# Patient Record
Sex: Male | Born: 1980 | Race: Black or African American | Hispanic: No | Marital: Single | Smoking: Former smoker
Health system: Southern US, Community
[De-identification: ages and names within clinical notes are randomized; demographics above are authoritative.]

## PROBLEM LIST (undated history)

## (undated) DIAGNOSIS — G5601 Carpal tunnel syndrome, right upper limb: Secondary | ICD-10-CM

## (undated) DIAGNOSIS — S329XXA Fracture of unspecified parts of lumbosacral spine and pelvis, initial encounter for closed fracture: Secondary | ICD-10-CM

## (undated) DIAGNOSIS — G43909 Migraine, unspecified, not intractable, without status migrainosus: Secondary | ICD-10-CM

---
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
## 2002-05-02 ENCOUNTER — Encounter: Payer: Self-pay | Admitting: Emergency Medicine

## 2002-05-02 ENCOUNTER — Emergency Department (HOSPITAL_COMMUNITY): Admission: EM | Admit: 2002-05-02 | Discharge: 2002-05-02 | Payer: Self-pay | Admitting: Emergency Medicine

## 2005-08-10 ENCOUNTER — Emergency Department (HOSPITAL_COMMUNITY): Admission: EM | Admit: 2005-08-10 | Discharge: 2005-08-10 | Payer: Self-pay | Admitting: Family Medicine

## 2006-03-26 ENCOUNTER — Emergency Department (HOSPITAL_COMMUNITY): Admission: EM | Admit: 2006-03-26 | Discharge: 2006-03-26 | Payer: Self-pay | Admitting: Family Medicine

## 2006-03-28 ENCOUNTER — Emergency Department (HOSPITAL_COMMUNITY): Admission: EM | Admit: 2006-03-28 | Discharge: 2006-03-28 | Payer: Self-pay | Admitting: Family Medicine

## 2006-04-05 ENCOUNTER — Emergency Department (HOSPITAL_COMMUNITY): Admission: EM | Admit: 2006-04-05 | Discharge: 2006-04-05 | Payer: Self-pay | Admitting: Family Medicine

## 2006-04-20 ENCOUNTER — Emergency Department (HOSPITAL_COMMUNITY): Admission: EM | Admit: 2006-04-20 | Discharge: 2006-04-20 | Payer: Self-pay | Admitting: Family Medicine

## 2006-09-30 ENCOUNTER — Emergency Department (HOSPITAL_COMMUNITY): Admission: EM | Admit: 2006-09-30 | Discharge: 2006-09-30 | Payer: Self-pay | Admitting: Emergency Medicine

## 2007-10-08 ENCOUNTER — Emergency Department (HOSPITAL_COMMUNITY): Admission: EM | Admit: 2007-10-08 | Discharge: 2007-10-08 | Payer: Self-pay | Admitting: Emergency Medicine

## 2008-11-17 ENCOUNTER — Emergency Department (HOSPITAL_COMMUNITY): Admission: EM | Admit: 2008-11-17 | Discharge: 2008-11-17 | Payer: Self-pay | Admitting: Emergency Medicine

## 2008-11-18 ENCOUNTER — Emergency Department (HOSPITAL_COMMUNITY): Admission: EM | Admit: 2008-11-18 | Discharge: 2008-11-18 | Payer: Self-pay | Admitting: Emergency Medicine

## 2009-10-06 ENCOUNTER — Emergency Department (HOSPITAL_COMMUNITY): Admission: EM | Admit: 2009-10-06 | Discharge: 2009-10-06 | Payer: Self-pay | Admitting: Family Medicine

## 2010-08-12 ENCOUNTER — Inpatient Hospital Stay (INDEPENDENT_AMBULATORY_CARE_PROVIDER_SITE_OTHER)
Admission: RE | Admit: 2010-08-12 | Discharge: 2010-08-12 | Disposition: A | Discharge status: Home / Self Care | Payer: Self-pay | Source: Ambulatory Visit | Attending: Family Medicine | Admitting: Family Medicine

## 2010-08-12 DIAGNOSIS — K5289 Other specified noninfective gastroenteritis and colitis: Secondary | ICD-10-CM

## 2010-08-27 ENCOUNTER — Emergency Department (HOSPITAL_COMMUNITY): Payer: Self-pay

## 2010-08-27 ENCOUNTER — Emergency Department (HOSPITAL_COMMUNITY)
Admission: EM | Admit: 2010-08-27 | Discharge: 2010-08-27 | Disposition: A | Discharge status: Home / Self Care | Payer: Self-pay | Attending: Emergency Medicine | Admitting: Emergency Medicine

## 2010-08-27 DIAGNOSIS — IMO0002 Reserved for concepts with insufficient information to code with codable children: Secondary | ICD-10-CM | POA: Insufficient documentation

## 2010-08-27 DIAGNOSIS — M7989 Other specified soft tissue disorders: Secondary | ICD-10-CM | POA: Insufficient documentation

## 2010-08-27 DIAGNOSIS — G8929 Other chronic pain: Secondary | ICD-10-CM | POA: Insufficient documentation

## 2010-08-27 DIAGNOSIS — X58XXXA Exposure to other specified factors, initial encounter: Secondary | ICD-10-CM | POA: Insufficient documentation

## 2010-08-27 DIAGNOSIS — M79609 Pain in unspecified limb: Secondary | ICD-10-CM | POA: Insufficient documentation

## 2010-08-27 DIAGNOSIS — M549 Dorsalgia, unspecified: Secondary | ICD-10-CM | POA: Insufficient documentation

## 2010-09-24 ENCOUNTER — Inpatient Hospital Stay (INDEPENDENT_AMBULATORY_CARE_PROVIDER_SITE_OTHER)
Admission: RE | Admit: 2010-09-24 | Discharge: 2010-09-24 | Disposition: A | Discharge status: Home / Self Care | Payer: Self-pay | Source: Ambulatory Visit | Attending: Family Medicine | Admitting: Family Medicine

## 2010-09-24 DIAGNOSIS — M79609 Pain in unspecified limb: Secondary | ICD-10-CM

## 2010-09-24 DIAGNOSIS — M659 Synovitis and tenosynovitis, unspecified: Secondary | ICD-10-CM

## 2010-10-27 ENCOUNTER — Inpatient Hospital Stay (INDEPENDENT_AMBULATORY_CARE_PROVIDER_SITE_OTHER)
Admission: RE | Admit: 2010-10-27 | Discharge: 2010-10-27 | Disposition: A | Discharge status: Home / Self Care | Payer: Self-pay | Source: Ambulatory Visit | Attending: Family Medicine | Admitting: Family Medicine

## 2010-10-27 DIAGNOSIS — M940 Chondrocostal junction syndrome [Tietze]: Secondary | ICD-10-CM

## 2010-10-27 DIAGNOSIS — J3489 Other specified disorders of nose and nasal sinuses: Secondary | ICD-10-CM

## 2010-10-27 DIAGNOSIS — J309 Allergic rhinitis, unspecified: Secondary | ICD-10-CM

## 2010-12-01 ENCOUNTER — Emergency Department (HOSPITAL_COMMUNITY)
Admission: EM | Admit: 2010-12-01 | Discharge: 2010-12-02 | Disposition: A | Discharge status: Home / Self Care | Payer: Self-pay | Attending: Emergency Medicine | Admitting: Emergency Medicine

## 2010-12-01 DIAGNOSIS — R6889 Other general symptoms and signs: Secondary | ICD-10-CM | POA: Insufficient documentation

## 2010-12-01 DIAGNOSIS — J45909 Unspecified asthma, uncomplicated: Secondary | ICD-10-CM | POA: Insufficient documentation

## 2010-12-01 DIAGNOSIS — M79609 Pain in unspecified limb: Secondary | ICD-10-CM | POA: Insufficient documentation

## 2010-12-01 DIAGNOSIS — R0602 Shortness of breath: Secondary | ICD-10-CM | POA: Insufficient documentation

## 2010-12-01 DIAGNOSIS — M62838 Other muscle spasm: Secondary | ICD-10-CM | POA: Insufficient documentation

## 2010-12-01 DIAGNOSIS — M545 Low back pain, unspecified: Secondary | ICD-10-CM | POA: Insufficient documentation

## 2010-12-01 DIAGNOSIS — R0989 Other specified symptoms and signs involving the circulatory and respiratory systems: Secondary | ICD-10-CM | POA: Insufficient documentation

## 2010-12-01 DIAGNOSIS — R0609 Other forms of dyspnea: Secondary | ICD-10-CM | POA: Insufficient documentation

## 2010-12-02 ENCOUNTER — Emergency Department (HOSPITAL_COMMUNITY): Payer: Self-pay

## 2010-12-02 LAB — DIFFERENTIAL
Basophils Absolute: 0 10*3/uL (ref 0.0–0.1)
Basophils Relative: 0 % (ref 0–1)
Eosinophils Absolute: 0.6 10*3/uL (ref 0.0–0.7)
Eosinophils Relative: 8 % — ABNORMAL HIGH (ref 0–5)
Lymphocytes Relative: 31 % (ref 12–46)
Lymphs Abs: 2.2 10*3/uL (ref 0.7–4.0)
Monocytes Absolute: 0.5 10*3/uL (ref 0.1–1.0)
Monocytes Relative: 7 % (ref 3–12)
Neutro Abs: 3.7 10*3/uL (ref 1.7–7.7)
Neutrophils Relative %: 53 % (ref 43–77)

## 2010-12-02 LAB — CBC
HCT: 34.6 % — ABNORMAL LOW (ref 39.0–52.0)
Hemoglobin: 12.5 g/dL — ABNORMAL LOW (ref 13.0–17.0)
MCH: 27.9 pg (ref 26.0–34.0)
MCHC: 36.1 g/dL — ABNORMAL HIGH (ref 30.0–36.0)
MCV: 77.2 fL — ABNORMAL LOW (ref 78.0–100.0)
Platelets: 193 10*3/uL (ref 150–400)
RBC: 4.48 MIL/uL (ref 4.22–5.81)
RDW: 12.9 % (ref 11.5–15.5)
WBC: 7 10*3/uL (ref 4.0–10.5)

## 2010-12-02 LAB — COMPREHENSIVE METABOLIC PANEL
ALT: 41 U/L (ref 0–53)
AST: 35 U/L (ref 0–37)
Albumin: 3.8 g/dL (ref 3.5–5.2)
Alkaline Phosphatase: 49 U/L (ref 39–117)
BUN: 19 mg/dL (ref 6–23)
CO2: 28 mEq/L (ref 19–32)
Calcium: 8.8 mg/dL (ref 8.4–10.5)
Chloride: 101 mEq/L (ref 96–112)
Creatinine, Ser: 1.05 mg/dL (ref 0.4–1.5)
GFR calc Af Amer: >60 mL/min (ref >60)
GFR calc non Af Amer: >60 mL/min (ref >60)
Glucose, Bld: 132 mg/dL — ABNORMAL HIGH (ref 70–99)
Potassium: 3.6 mEq/L (ref 3.5–5.1)
Sodium: 136 mEq/L (ref 135–145)
Total Bilirubin: 0.2 mg/dL — ABNORMAL LOW (ref 0.3–1.2)
Total Protein: 6.7 g/dL (ref 6.0–8.3)

## 2010-12-02 LAB — CK TOTAL AND CKMB (NOT AT ARMC)
CK, MB: 5.7 ng/mL — ABNORMAL HIGH (ref 0.3–4.0)
Relative Index: 1.1 (ref 0.0–2.5)
Total CK: 540 U/L — ABNORMAL HIGH (ref 7–232)

## 2011-03-15 LAB — POCT URINALYSIS DIP (DEVICE)
Hgb urine dipstick: NEGATIVE
Ketones, ur: NEGATIVE
Operator id: 116391
Protein, ur: NEGATIVE
Specific Gravity, Urine: 1.015

## 2011-06-21 DEATH — deceased

## 2011-11-09 ENCOUNTER — Emergency Department (HOSPITAL_COMMUNITY)
Admission: EM | Admit: 2011-11-09 | Discharge: 2011-11-09 | Disposition: A | Discharge status: Home / Self Care | Payer: Self-pay | Attending: Emergency Medicine | Admitting: Emergency Medicine

## 2011-11-09 ENCOUNTER — Emergency Department (HOSPITAL_COMMUNITY): Payer: Self-pay

## 2011-11-09 ENCOUNTER — Encounter (HOSPITAL_COMMUNITY): Payer: Self-pay | Admitting: Emergency Medicine

## 2011-11-09 DIAGNOSIS — S6710XA Crushing injury of unspecified finger(s), initial encounter: Secondary | ICD-10-CM | POA: Insufficient documentation

## 2011-11-09 DIAGNOSIS — M7989 Other specified soft tissue disorders: Secondary | ICD-10-CM | POA: Insufficient documentation

## 2011-11-09 DIAGNOSIS — W230XXA Caught, crushed, jammed, or pinched between moving objects, initial encounter: Secondary | ICD-10-CM | POA: Insufficient documentation

## 2011-11-09 DIAGNOSIS — S61209A Unspecified open wound of unspecified finger without damage to nail, initial encounter: Secondary | ICD-10-CM | POA: Insufficient documentation

## 2011-11-09 DIAGNOSIS — J45909 Unspecified asthma, uncomplicated: Secondary | ICD-10-CM | POA: Insufficient documentation

## 2011-11-09 DIAGNOSIS — S6000XA Contusion of unspecified finger without damage to nail, initial encounter: Secondary | ICD-10-CM | POA: Insufficient documentation

## 2011-11-09 DIAGNOSIS — M79609 Pain in unspecified limb: Secondary | ICD-10-CM | POA: Insufficient documentation

## 2011-11-09 DIAGNOSIS — T148XXA Other injury of unspecified body region, initial encounter: Secondary | ICD-10-CM

## 2011-11-09 MED ORDER — IBUPROFEN 800 MG PO TABS: 800.0000 mg | ORAL_TABLET | Freq: Three times a day (TID) | ORAL | Status: AC

## 2011-11-09 MED ORDER — LIDOCAINE HCL (PF) 1 % IJ SOLN
5.0000 mL | Freq: Once | INTRAMUSCULAR | Status: AC
  Administered 2011-11-09: 5 mL
  Filled 2011-11-09: qty 5

## 2011-11-09 MED ORDER — IBUPROFEN 800 MG PO TABS
800.0000 mg | ORAL_TABLET | Freq: Once | ORAL | Status: AC
  Administered 2011-11-09: 800 mg via ORAL
  Filled 2011-11-09: qty 1

## 2011-11-09 MED ORDER — OXYCODONE-ACETAMINOPHEN 5-325 MG PO TABS: 1.0000 | ORAL_TABLET | Freq: Once | ORAL | Status: DC

## 2011-11-09 MED ORDER — TETANUS-DIPHTH-ACELL PERTUSSIS 5-2.5-18.5 LF-MCG/0.5 IM SUSP
0.5000 mL | Freq: Once | INTRAMUSCULAR | Status: AC
  Administered 2011-11-09: 0.5 mL via INTRAMUSCULAR
  Filled 2011-11-09: qty 0.5

## 2011-11-09 MED ORDER — OXYCODONE-ACETAMINOPHEN 5-325 MG PO TABS: 2.0000 | ORAL_TABLET | ORAL | Status: AC | PRN

## 2011-11-09 NOTE — ED Notes (Addendum)
Patient is AOx4 and comfortable with his discharge instructions. 

## 2011-11-09 NOTE — ED Provider Notes (Signed)
History     CSN: [id number]  Arrival date & time 11/09/11  4098   First MD Initiated Contact with Patient 11/09/11 [phone]      Chief Complaint  Patient presents with  . Finger Injury    (Consider location/radiation/quality/duration/timing/severity/associated sxs/prior treatment) HPI History provided by patient. Cleaning his car last night and slammed right middle finger in the car door. Sustained a small laceration to the distal tip of the finger. He developed swelling and sharp pain. He went to bed but woke up at 3 AM with severe throbbing pain, bruising under the nail bed, and unable to go back to sleep due to pain. No radiation. No other injury. No history of same. Last tetanus shot unknown. Hurts to touch that area. No known alleviating factors Past Medical History  Diagnosis Date  . Asthma     History reviewed. No pertinent past surgical history.  No family history on file.  History  Substance Use Topics  . Smoking status: Current Everyday Smoker  . Smokeless tobacco: Not on file  . Alcohol Use: No      Review of Systems  Constitutional: Negative for fever.  HENT: Negative for neck stiffness.   Respiratory: Negative for shortness of breath.   Cardiovascular: Negative for chest pain.  Gastrointestinal: Negative for abdominal pain.  Genitourinary: Negative for dysuria.  Skin: Negative for rash.  All other systems reviewed and are negative.    Allergies  Review of patient's allergies indicates no known allergies.  Home Medications  No current outpatient prescriptions on file.  BP 148/79  Pulse 71  Temp(Src) 98.3 F (36.8 C) (Oral)  Resp 14  SpO2 100%  Physical Exam  Constitutional: He is oriented to person, place, and time. He appears well-developed and well-nourished.  HENT:  Head: Normocephalic and atraumatic.  Eyes: EOM are normal. Pupils are equal, round, and reactive to light.  Neck: Trachea normal. Neck supple.  Cardiovascular: Normal rate,  regular rhythm, S1 normal, S2 normal and normal pulses.     No systolic murmur is present   No diastolic murmur is present  Pulses:      Radial pulses are 2+ on the right side, and 2+ on the left side.  Pulmonary/Chest: Effort normal and breath sounds normal. He has no wheezes. He has no rhonchi. He has no rales. He exhibits no tenderness.  Musculoskeletal:       Right third digit with tenderness over the distal phalanx with associated swelling. There is very small superficial laceration to the distal pad. There is a moderate subungual hematoma to the nail. No obvious bony deformity. Decreased range of motion at the DIP. Otherwise no hand or wrist tenderness with full range of motion.  Neurological: He is alert and oriented to person, place, and time. He has normal strength. No cranial nerve deficit or sensory deficit. GCS eye subscore is 4. GCS verbal subscore is 5. GCS motor subscore is 6.  Skin: Skin is warm and dry. No rash noted. He is not diaphoretic.  Psychiatric: His speech is normal.       Cooperative and appropriate    ED Course  NERVE BLOCK Date/Time: 11/09/2011 4:38 AM Performed by: Sunnie Nielsen Authorized by: Sunnie Nielsen Consent: Verbal consent obtained. Risks and benefits: risks, benefits and alternatives were discussed Consent given by: patient Patient understanding: patient states understanding of the procedure being performed Patient consent: the patient's understanding of the procedure matches consent given Procedure consent: procedure consent matches procedure scheduled Required items: required  blood products, implants, devices, and special equipment available Patient identity confirmed: verbally with patient Time out: Immediately prior to procedure a "time out" was called to verify the correct patient, procedure, equipment, support staff and site/side marked as required. Indications: pain relief Nerve block body site: Right third digit. Preparation: Patient was  prepped and draped in the usual sterile fashion. Patient position: sitting Needle gauge: 27 G Location technique: anatomical landmarks Local anesthetic: lidocaine 1% without epinephrine Anesthetic total: 4 ml Outcome: pain improved Patient tolerance: Patient tolerated the procedure well with no immediate complications.   after digital block as above, nail trephination performed  Procedure: Trephination 4:40 AM Consent obtained. Timeout performed. Bovie used to trephinate nailbed of the third digit. Mild bleeding was minimal blood loss. Patient tolerated procedure well.   Labs Reviewed - No data to display Dg Finger Middle Right  11/09/2011  *RADIOLOGY REPORT*  Clinical Data: Right third digit trauma.Swelling.  RIGHT MIDDLE FINGER 2+V  Comparison: None.  Findings: There is a soft tissue swelling about the PIP of the third digit. Subtle radiopaque density along the ulnar aspect of the base of the distal phalanx. No displaced fracture is not identified.  No radiopaque foreign body.  IMPRESSION: Soft tissue swelling.  Subtle radiopaque density along the ulnar aspect of the base of the distal phalanx.  This is favored to be artifactual secondary to overlying shadows however correlate with the location of trauma/point tenderness to exclude a small fracture.  Original Report Authenticated By: Waneta Martins, M.D.    X-ray reviewed as above - no obvious fracture. Nondisplaced fracture would not change medical management.   MDM   Isolated right third digit crush injury with tetanus updated, x-rays above. No obvious fracture. Digital block. Nails are trephination. Finger splint placed. Motrin for pain. Prescription provided. Reliable historian verbalizes understanding all discharge and followup instructions.        Sunnie Nielsen, MD 11/09/11 [phone]

## 2011-11-09 NOTE — ED Notes (Signed)
PT. REPORTS RIGHT MIDDLE FINGER INJURY SMASHED BETWEEN SUV DOOR LAST NIGHT , PRESENTS WITH SWELLING AT RIGHT DISTAL MIDDLE FINGER / LACERATION -NO BLEEDING .

## 2011-11-09 NOTE — ED Notes (Signed)
MD at bedside for injection of lidocaine to middle right finger for pain/comfort measures. Pt tolerate procedure well, plan of care updated with verbal understanding and will continue to monitor pt.

## 2011-11-09 NOTE — Discharge Instructions (Signed)
Crush Injury, Fingers or Toes  A crush injury to the fingers or toes means the tissues have been damaged by being squeezed (compressed). There will be bleeding into the tissues and swelling. Often, blood will collect under the skin. When this happens, the skin on the finger often dies and may slough off (shed) 1 week to 10 days later. Usually, new skin is growing underneath. If the injury has been too severe and the tissue does not survive, the damaged tissue may begin to turn black over several days.  Wounds which occur because of the crushing may be stitched (sutured) shut. However, crush injuries are more likely to become infected than other injuries. These wounds may not be closed as tightly as other types of cuts to prevent infection. Nails involved are often lost. These usually grow back over several weeks.  DIAGNOSIS  X-rays may be taken to see if there is any injury to the bones.  TREATMENT  Broken bones (fractures) may be treated with splinting, depending on the fracture. Often, no treatment is required for fractures of the last bone in the fingers or toes.  HOME CARE INSTRUCTIONS  The crushed part should be raised (elevated) above the heart or center of the chest as much as possible for the first several days or as directed. This helps with pain and lessens swelling. Less swelling increases the chances that the crushed part will survive.  Put ice on the injured area.  Put ice in a plastic bag.  Place a towel between your skin and the bag.  Leave the ice on for 15 to 20 minutes, 3 to 4 times a day for the first 2 days.  Only take over-the-counter or prescription medicines for pain, discomfort, or fever as directed by your caregiver.  Use your injured part only as directed.  Change your bandages (dressings) as directed.  Keep all follow-up appointments as directed by your caregiver. Not keeping your appointment could result in a chronic or permanent injury, pain, and disability. If there is  any problem keeping the appointment, you must call to reschedule.  SEEK IMMEDIATE MEDICAL CARE IF:  There is redness, swelling, or increasing pain in the wound area.  Pus is coming from the wound.  You have a fever.  You notice a bad smell coming from the wound or dressing.  The edges of the wound do not stay together after the sutures have been removed.  You are unable to move the injured finger or toe.  MAKE SURE YOU:  Understand these instructions.  Will watch your condition.  Will get help right away if you are not doing well or get worse.

## 2011-12-14 ENCOUNTER — Emergency Department (INDEPENDENT_AMBULATORY_CARE_PROVIDER_SITE_OTHER)
Admission: EM | Admit: 2011-12-14 | Discharge: 2011-12-14 | Disposition: A | Discharge status: Home / Self Care | Payer: Self-pay | Source: Home / Self Care

## 2011-12-14 ENCOUNTER — Encounter (HOSPITAL_COMMUNITY): Payer: Self-pay | Admitting: Emergency Medicine

## 2011-12-14 DIAGNOSIS — G43109 Migraine with aura, not intractable, without status migrainosus: Secondary | ICD-10-CM

## 2011-12-14 HISTORY — DX: Migraine, unspecified, not intractable, without status migrainosus: G43.909

## 2011-12-14 MED ORDER — IBUPROFEN 800 MG PO TABS
ORAL_TABLET | ORAL | Status: AC
  Filled 2011-12-14: qty 1

## 2011-12-14 MED ORDER — SUMATRIPTAN SUCCINATE 6 MG/0.5ML ~~LOC~~ SOLN
6.0000 mg | Freq: Once | SUBCUTANEOUS | Status: AC
  Administered 2011-12-14: 6 mg via SUBCUTANEOUS

## 2011-12-14 MED ORDER — METHYLPREDNISOLONE ACETATE 80 MG/ML IJ SUSP
INTRAMUSCULAR | Status: AC
  Filled 2011-12-14: qty 1

## 2011-12-14 MED ORDER — SUMATRIPTAN SUCCINATE 50 MG PO TABS: 100.0000 mg | ORAL_TABLET | ORAL | Status: DC | PRN

## 2011-12-14 MED ORDER — SUMATRIPTAN SUCCINATE 6 MG/0.5ML ~~LOC~~ SOLN
SUBCUTANEOUS | Status: AC
  Filled 2011-12-14: qty 0.5

## 2011-12-14 MED ORDER — IBUPROFEN 800 MG PO TABS
800.0000 mg | ORAL_TABLET | ORAL | Status: AC
  Administered 2011-12-14: 800 mg via ORAL

## 2011-12-14 MED ORDER — METHYLPREDNISOLONE ACETATE 40 MG/ML IJ SUSP
80.0000 mg | Freq: Once | INTRAMUSCULAR | Status: AC
  Administered 2011-12-14: 80 mg via INTRAMUSCULAR

## 2011-12-14 NOTE — ED Notes (Signed)
Reports having headaches/migraines as a child involving vision changes.  Reports these headaches/migraines gradually left in the 20"s.  Over the past five years headaches have returned.  Over the past 2 weeks headaches every day.  These headaches involve left side of head.  Reports mucous in throat and coughing recently.  No fever.  No vision changes.  Reports headaches do go away completely, but reoccur.

## 2011-12-14 NOTE — ED Provider Notes (Signed)
History     CSN: [id number]  Arrival date & time 12/14/11  1151   First MD Initiated Contact with Patient 12/14/11 1244      Chief Complaint  Patient presents with  . Headache     HPI Left sided headaches for 2 wks with sensitivity to light and sound. No nausea or vomiting. 10/10 occurs multiple time a day. Sleeping and goody powders help. He had these headaches many yrs ago and cannot recall what made them better.   Past Medical History  Diagnosis Date  . Asthma   . Migraines     History reviewed. No pertinent past surgical history.  No family history on file.  History  Substance Use Topics  . Smoking status: Current Everyday Smoker  . Smokeless tobacco: Not on file  . Alcohol Use: Yes      Review of Systems  Constitutional: Negative.   HENT:       Left sided headache on and off 2 wks.   Eyes: Negative.   Respiratory: Positive for cough.        Cough with green sputum for 2 wks- getting better on its own.   Cardiovascular: Negative.   Gastrointestinal: Negative.   Genitourinary: Negative.   Musculoskeletal: Negative.   Neurological: Positive for headaches. Negative for dizziness, light-headedness and numbness.  Psychiatric/Behavioral: Negative.     Allergies  Review of patient's allergies indicates no known allergies.  Home Medications   Current Outpatient Rx  Name Route Sig Dispense Refill  . GOODY HEADACHE PO Oral Take by mouth.    . ASPIRIN-CAFFEINE 845-65 MG PO PACK Oral Take by mouth.      BP 127/63  Pulse 82  Temp 98 F (36.7 C) (Oral)  Resp 16  SpO2 100%  Physical Exam  ED Course  Procedures (including critical care time)  Labs Reviewed - No data to display No results found.   No diagnosis found.    MDM  Migrane headache Acute (viral) bronchtiis Suspect onset of bronchitis triggered the headaches - Imitrex, prescription, [facility] if they continue to occur.         Calvert Cantor,  MD 12/14/11 1510

## 2011-12-14 NOTE — Discharge Instructions (Signed)
Go to [employer]. com for more coupons Migraine Headache A migraine headache is an intense, throbbing pain on one or both sides of your head. The exact cause of a migraine headache is not always known. A migraine may be caused when nerves in the brain become irritated and release chemicals that cause swelling within blood vessels, causing pain. Many migraine sufferers have a family history of migraines. Before you get a migraine you may or may not get an aura. An aura is a group of symptoms that can predict the beginning of a migraine. An aura may include:  Visual changes such as:   Flashing lights.   Bright spots or zig-zag lines.   Tunnel vision.   Feelings of numbness.   Trouble talking.   Muscle weakness.  SYMPTOMS  Pain on one or both sides of your head.   Pain that is pulsating or throbbing in nature.   Pain that is severe enough to prevent daily activities.   Pain that is aggravated by any daily physical activity.   Nausea (feeling sick to your stomach), vomiting, or both.   Pain with exposure to bright lights, loud noises, or activity.   General sensitivity to bright lights or loud noises.  MIGRAINE TRIGGERS Examples of triggers of migraine headaches include:   Alcohol.   Smoking.   Stress.   It may be related to menses (male menstruation).   Aged cheeses.   Foods or drinks that contain nitrates, glutamate, aspartame, or tyramine.   Lack of sleep.   Chocolate.   Caffeine.   Hunger.   Medications such as nitroglycerine (used to treat chest pain), birth control pills, estrogen, and some blood pressure medications.  DIAGNOSIS  A migraine headache is often diagnosed based on:  Symptoms.   Physical examination.   A computerized X-ray scan (computed tomography, CT) of your head.  TREATMENT  Medications can help prevent migraines if they are recurrent or should they become recurrent. Your caregiver can help you with a medication or treatment program  that will be helpful to you.   Lying down in a dark, quiet room may be helpful.   Keeping a headache diary may help you find a trend as to what may be triggering your headaches.  SEEK IMMEDIATE MEDICAL CARE IF:   You have confusion, personality changes or seizures.   You have headaches that wake you from sleep.   You have an increased frequency in your headaches.   You have a stiff neck.   You have a loss of vision.   You have muscle weakness.   You start losing your balance or have trouble walking.   You feel faint or pass out.  MAKE SURE YOU:   Understand these instructions.   Will watch your condition.   Will get help right away if you are not doing well or get worse.  Document Released: 06/06/2005 Document Revised: 05/26/2011 Document Reviewed: 01/20/2009 [facility] Patient Information 2012 [place], [place].

## 2011-12-14 NOTE — ED Notes (Signed)
Reports last night his headache was the worst it had been

## 2011-12-16 ENCOUNTER — Telehealth (HOSPITAL_COMMUNITY): Payer: Self-pay | Admitting: *Deleted

## 2011-12-16 NOTE — ED Notes (Signed)
Referral request, chart and demographic sheet faxed to [facility]. Matthew Lester 12/16/2011

## 2012-01-06 ENCOUNTER — Telehealth (HOSPITAL_COMMUNITY): Payer: Self-pay | Admitting: *Deleted

## 2012-01-06 NOTE — ED Notes (Signed)
7/18 [facility]ed [facility] form back.  They called pt. 3 x on 7/1, 7/8 and 7/18 and left messages for pt. to call. Unable to schedule appointment. I called 7/18 as well and left a message to call. Vassie Moselle 01/06/2012

## 2012-01-06 NOTE — ED Notes (Signed)
Person that answered his cell number said he was out of town but he would give him a message to call. Matthew Lester 01/06/2012

## 2012-02-10 ENCOUNTER — Telehealth (HOSPITAL_COMMUNITY): Payer: Self-pay | Admitting: *Deleted

## 2012-02-25 ENCOUNTER — Emergency Department (HOSPITAL_COMMUNITY)
Admission: EM | Admit: 2012-02-25 | Discharge: 2012-02-25 | Disposition: A | Discharge status: Home / Self Care | Payer: Self-pay | Attending: Emergency Medicine | Admitting: Emergency Medicine

## 2012-02-25 ENCOUNTER — Encounter (HOSPITAL_COMMUNITY): Payer: Self-pay | Admitting: *Deleted

## 2012-02-25 DIAGNOSIS — K047 Periapical abscess without sinus: Secondary | ICD-10-CM | POA: Insufficient documentation

## 2012-02-25 DIAGNOSIS — F17200 Nicotine dependence, unspecified, uncomplicated: Secondary | ICD-10-CM | POA: Insufficient documentation

## 2012-02-25 DIAGNOSIS — J45909 Unspecified asthma, uncomplicated: Secondary | ICD-10-CM | POA: Insufficient documentation

## 2012-02-25 MED ORDER — PENICILLIN V POTASSIUM 250 MG PO TABS
500.0000 mg | ORAL_TABLET | Freq: Once | ORAL | Status: AC
  Administered 2012-02-25: 500 mg via ORAL
  Filled 2012-02-25: qty 2

## 2012-02-25 MED ORDER — TRAMADOL HCL 50 MG PO TABS: 50.0000 mg | ORAL_TABLET | Freq: Four times a day (QID) | ORAL | Status: AC | PRN

## 2012-02-25 MED ORDER — TRAMADOL HCL 50 MG PO TABS
50.0000 mg | ORAL_TABLET | Freq: Once | ORAL | Status: AC
  Administered 2012-02-25: 50 mg via ORAL
  Filled 2012-02-25: qty 1

## 2012-02-25 MED ORDER — PENICILLIN V POTASSIUM 500 MG PO TABS: 500.0000 mg | ORAL_TABLET | Freq: Four times a day (QID) | ORAL | Status: AC

## 2012-02-25 NOTE — ED Provider Notes (Signed)
History     CSN: [id number]  Arrival date & time 02/25/12  0301   First MD Initiated Contact with Patient 02/25/12 0411      Chief Complaint  Patient presents with  . Dental Pain    (Consider location/radiation/quality/duration/timing/severity/associated sxs/prior treatment) HPI Comments: Patient reports, dental pain for the past 4, days.  He's taking Goody's powder with minimal relief.  Does not have a local dentist  Patient is a 31 y.o. male presenting with tooth pain.  Dental PainThe primary symptoms include mouth pain. Primary symptoms do not include dental injury, headaches or fever. The symptoms began 3 to 5 days ago. The symptoms are worsening.    Past Medical History  Diagnosis Date  . Asthma   . Migraines     History reviewed. No pertinent past surgical history.  No family history on file.  History  Substance Use Topics  . Smoking status: Current Everyday Smoker  . Smokeless tobacco: Not on file  . Alcohol Use: Yes      Review of Systems  Constitutional: Negative for fever and chills.  Gastrointestinal: Negative for nausea.  Skin: Negative for wound.  Neurological: Negative for weakness and headaches.    Allergies  Review of patient's allergies indicates no known allergies.  Home Medications   Current Outpatient Rx  Name Route Sig Dispense Refill  . PENICILLIN V POTASSIUM 500 MG PO TABS Oral Take 1 tablet (500 mg total) by mouth 4 (four) times daily. 40 tablet 0  . TRAMADOL HCL 50 MG PO TABS Oral Take 1 tablet (50 mg total) by mouth every 6 (six) hours as needed for pain. 30 tablet 0    BP 123/72  Pulse 69  Temp 98.3 F (36.8 C) (Oral)  Resp 18  SpO2 100%  Physical Exam  Constitutional: He appears well-developed and well-nourished.  HENT:  Head: Normocephalic.  Mouth/Throat:    Cardiovascular: Normal rate.   Pulmonary/Chest: Effort normal.  Musculoskeletal: Normal range of motion.  Neurological: He is alert.  Skin: Skin is warm and  dry.    ED Course  Procedures (including critical care time)  Labs Reviewed - No data to display No results found.   1. Dental abscess       MDM   Dental, abscess.  We'll treat with penicillin, and Ultram, are refer to Dr. Burgess Estelle to call on Monday for further treatment        Arman Filter, NP 02/26/12 2152

## 2012-02-25 NOTE — ED Notes (Signed)
C/o R lower dental pain, decay and gum swelling noted, (denies: nv, fever, or drainage). Denies sx other than pain.

## 2012-02-25 NOTE — ED Notes (Signed)
Pt to ED c/o dental pain since Wed.  Minimal relief with goodys.

## 2012-02-27 NOTE — ED Provider Notes (Signed)
Medical screening examination/treatment/procedure(s) were performed by non-physician practitioner and as supervising physician I was immediately available for consultation/collaboration.  Jasmine Awe, MD 02/27/12 [phone]

## 2012-06-09 ENCOUNTER — Emergency Department (HOSPITAL_COMMUNITY)
Admission: EM | Admit: 2012-06-09 | Discharge: 2012-06-09 | Disposition: A | Discharge status: Home / Self Care | Payer: Self-pay | Attending: Emergency Medicine | Admitting: Emergency Medicine

## 2012-06-09 ENCOUNTER — Encounter (HOSPITAL_COMMUNITY): Payer: Self-pay | Admitting: Emergency Medicine

## 2012-06-09 DIAGNOSIS — G8929 Other chronic pain: Secondary | ICD-10-CM | POA: Insufficient documentation

## 2012-06-09 DIAGNOSIS — X503XXA Overexertion from repetitive movements, initial encounter: Secondary | ICD-10-CM | POA: Insufficient documentation

## 2012-06-09 DIAGNOSIS — Y929 Unspecified place or not applicable: Secondary | ICD-10-CM | POA: Insufficient documentation

## 2012-06-09 DIAGNOSIS — Y9389 Activity, other specified: Secondary | ICD-10-CM | POA: Insufficient documentation

## 2012-06-09 DIAGNOSIS — J45909 Unspecified asthma, uncomplicated: Secondary | ICD-10-CM | POA: Insufficient documentation

## 2012-06-09 DIAGNOSIS — R05 Cough: Secondary | ICD-10-CM | POA: Insufficient documentation

## 2012-06-09 DIAGNOSIS — F17200 Nicotine dependence, unspecified, uncomplicated: Secondary | ICD-10-CM | POA: Insufficient documentation

## 2012-06-09 DIAGNOSIS — M549 Dorsalgia, unspecified: Secondary | ICD-10-CM | POA: Insufficient documentation

## 2012-06-09 DIAGNOSIS — IMO0002 Reserved for concepts with insufficient information to code with codable children: Secondary | ICD-10-CM | POA: Insufficient documentation

## 2012-06-09 DIAGNOSIS — R059 Cough, unspecified: Secondary | ICD-10-CM | POA: Insufficient documentation

## 2012-06-09 DIAGNOSIS — Z8679 Personal history of other diseases of the circulatory system: Secondary | ICD-10-CM | POA: Insufficient documentation

## 2012-06-09 DIAGNOSIS — M62838 Other muscle spasm: Secondary | ICD-10-CM | POA: Insufficient documentation

## 2012-06-09 MED ORDER — CYCLOBENZAPRINE HCL 10 MG PO TABS: 10.0000 mg | ORAL_TABLET | Freq: Two times a day (BID) | ORAL | Status: DC | PRN

## 2012-06-09 MED ORDER — IBUPROFEN 600 MG PO TABS: 600.0000 mg | ORAL_TABLET | Freq: Four times a day (QID) | ORAL | Status: DC | PRN

## 2012-06-09 MED ORDER — OXYCODONE-ACETAMINOPHEN 7.5-500 MG PO TABS: 1.0000 | ORAL_TABLET | ORAL | Status: DC | PRN

## 2012-06-09 NOTE — ED Notes (Signed)
Pt. Stated, i was lifting a lot of TVs yesterday by myself and when I woke up this am, I couldn't hardly move or breath it hurt so bad.
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
## 2012-06-25 ENCOUNTER — Emergency Department (HOSPITAL_COMMUNITY): Payer: Self-pay

## 2012-06-25 ENCOUNTER — Encounter (HOSPITAL_COMMUNITY): Payer: Self-pay | Admitting: Emergency Medicine

## 2012-06-25 ENCOUNTER — Emergency Department (HOSPITAL_COMMUNITY)
Admission: EM | Admit: 2012-06-25 | Discharge: 2012-06-26 | Disposition: A | Discharge status: Home / Self Care | Payer: Self-pay | Attending: Emergency Medicine | Admitting: Emergency Medicine

## 2012-06-25 DIAGNOSIS — Z23 Encounter for immunization: Secondary | ICD-10-CM | POA: Insufficient documentation

## 2012-06-25 DIAGNOSIS — L02519 Cutaneous abscess of unspecified hand: Secondary | ICD-10-CM | POA: Insufficient documentation

## 2012-06-25 DIAGNOSIS — Y939 Activity, unspecified: Secondary | ICD-10-CM | POA: Insufficient documentation

## 2012-06-25 DIAGNOSIS — Z8679 Personal history of other diseases of the circulatory system: Secondary | ICD-10-CM | POA: Insufficient documentation

## 2012-06-25 DIAGNOSIS — L03019 Cellulitis of unspecified finger: Secondary | ICD-10-CM | POA: Insufficient documentation

## 2012-06-25 DIAGNOSIS — X58XXXA Exposure to other specified factors, initial encounter: Secondary | ICD-10-CM | POA: Insufficient documentation

## 2012-06-25 DIAGNOSIS — J45909 Unspecified asthma, uncomplicated: Secondary | ICD-10-CM | POA: Insufficient documentation

## 2012-06-25 DIAGNOSIS — F17200 Nicotine dependence, unspecified, uncomplicated: Secondary | ICD-10-CM | POA: Insufficient documentation

## 2012-06-25 DIAGNOSIS — L0291 Cutaneous abscess, unspecified: Secondary | ICD-10-CM

## 2012-06-25 DIAGNOSIS — Y9269 Other specified industrial and construction area as the place of occurrence of the external cause: Secondary | ICD-10-CM | POA: Insufficient documentation

## 2012-06-25 MED ORDER — CEPHALEXIN 250 MG PO CAPS
500.0000 mg | ORAL_CAPSULE | Freq: Once | ORAL | Status: AC
  Administered 2012-06-26: 500 mg via ORAL
  Filled 2012-06-25: qty 2

## 2012-06-25 MED ORDER — SULFAMETHOXAZOLE-TMP DS 800-160 MG PO TABS
2.0000 | ORAL_TABLET | Freq: Once | ORAL | Status: AC
  Administered 2012-06-26: 2 via ORAL
  Filled 2012-06-25: qty 2

## 2012-06-25 NOTE — ED Notes (Signed)
Patient reports that he cut his right thumb last week doing yard work; unknown what he cut his thumb on.  Patient reports that thumb became really swollen on Saturday; thumb still notably swollen, but patient states that it is better than it was on Saturday.  Patient reports that he had a tetanus shot last year.

## 2012-06-25 NOTE — ED Provider Notes (Addendum)
History   This chart was scribed for Derwood Kaplan, MD by Gerlean Ren, ED Scribe. This patient was seen in room TR09C/TR09C and the patient's care was started at 10:32 PM    CSN: [id number]  Arrival date & time 06/25/12  2009   None     Chief Complaint  Patient presents with  . Finger Injury     The history is provided by the patient. No language interpreter was used.   Matthew Lester is a 32 y.o. male with h/o asthma who presents to the Emergency Department complaining of a scabbed over laceration over right thumb sustained while doing yard work 5 days ago that caused gradually worsening swelling worst 2 days ago that has since reduced but is still present.  Pt is not sure of how he sustained the laceration, but knows it occurred while doing yard work.  Pt reports tetanus shot last year.  Pt has no further complaints at this time.  Pt is a current everyday smoker and reports alcohol use.   Past Medical History  Diagnosis Date  . Asthma   . Migraines     History reviewed. No pertinent past surgical history.  History reviewed. No pertinent family history.  History  Substance Use Topics  . Smoking status: Current Every Day Smoker  . Smokeless tobacco: Not on file  . Alcohol Use: Yes      Review of Systems  Skin: Positive for wound.  Neurological: Negative for weakness and numbness.    Allergies  Review of patient's allergies indicates no known allergies.  Home Medications   Current Outpatient Rx  Name  Route  Sig  Dispense  Refill  . CYCLOBENZAPRINE HCL 10 MG PO TABS   Oral   Take 1 tablet (10 mg total) by mouth 2 (two) times daily as needed for muscle spasms.   20 tablet   0   . IBUPROFEN 600 MG PO TABS   Oral   Take 1 tablet (600 mg total) by mouth every 6 (six) hours as needed for pain.   30 tablet   0   . OXYCODONE-ACETAMINOPHEN 7.5-500 MG PO TABS   Oral   Take 1 tablet by mouth every 4 (four) hours as needed for pain.   30 tablet   0     BP  142/92  Pulse 98  Temp 98.4 F (36.9 C) (Oral)  Resp 16  SpO2 99%  Physical Exam  Nursing note and vitals reviewed. Constitutional: He is oriented to person, place, and time. He appears well-developed and well-nourished. No distress.  HENT:  Head: Normocephalic and atraumatic.  Eyes: EOM are normal.  Neck: Neck supple. No tracheal deviation present.  Cardiovascular: Normal rate.   Pulmonary/Chest: Effort normal. No respiratory distress.  Musculoskeletal: Normal range of motion.       Right hand 2+ radial pulse, dorsal aspect of right thumb has 3cm area of edema with overlying scab, mild erythema, and fluctuance, tender to palpation, ROM of IP joint limited to pain, no increased tenderness with passive flexion/extension of thumb, thumb is not held in flexion  Neurological: He is alert and oriented to person, place, and time.  Skin: Skin is warm and dry.  Psychiatric: He has a normal mood and affect. His behavior is normal.    ED Course  NERVE BLOCK Date/Time: 06/26/2012 12:03 AM Performed by: Derwood Kaplan Authorized by: Derwood Kaplan Consent: Verbal consent obtained. Consent given by: patient Patient understanding: patient states understanding of the procedure being  performed Patient identity confirmed: verbally with patient Indications: pain relief and wound distortion Body area: upper extremity Laterality: right Patient sedated: no Patient position: sitting Needle gauge: 25 G Location technique: anatomical landmarks Local anesthetic: lidocaine 2% without epinephrine Anesthetic total: 4 ml Outcome: pain improved Patient tolerance: Patient tolerated the procedure well with no immediate complications.    INCISION AND DRAINAGE Performed by: Derwood Kaplan Consent: Verbal consent obtained. Risks and benefits: risks, benefits and alternatives were discussed Type: abscess  Body area: right thumb  Anesthesia: local infiltration  Incision was made with a  scalpel.  Local anesthetic: lidocaine 2% w/o  epinephrine  Anesthetic total: 4 ml  Complexity: complex Blunt dissection to break up loculations  Drainage: purulent  Drainage amount: 1-2 cc   Patient tolerance: Patient tolerated the procedure well with no immediate complications.      (including critical care time) DIAGNOSTIC STUDIES: Oxygen Saturation is 99% on room air, normal by my interpretation.    COORDINATION OF CARE: 10:38 PM- Patient informed of clinical course, understands medical decision-making process, and agrees with plan.     Dg Finger Thumb Right  06/25/2012  *RADIOLOGY REPORT*  Clinical Data: No known injury to thumb, pain  RIGHT THUMB 2+V  Comparison: None.  Findings: Soft tissue swelling at the interphalangeal joint.  No fracture or foreign body.  IMPRESSION: As above.   Original Report Authenticated By: Davonna Belling, M.D.      No diagnosis found.    MDM  I personally performed the services described in this documentation, which was scribed in my presence. The recorded information has been reviewed and is accurate.  Pt comes in with cc of thumb injury. Has swelling, and some fluctuance at the dorsum of the IP joint. ROM compromised. I made a vertical incision, medial to the scab, and had 1-2 cc purulent discharge. I dont think the joint is involved. Based on the exam - i have no Kanavels sigh to suspect tenosynovitis. Patient likely has localized thumb infection and it hasnt spread yet.....  Patient advised to see Orthopedic Doctor by Thursday - he is hesitant, as he has no insurance. I asked him to return to the ER then.  Pt understands that the infection can spread, and it might lead to severe infection, and might need amputation if he waits too long. Pt advised to return to the ER immediately if the area is getting worse.      Derwood Kaplan, MD 06/26/12 0002  Derwood Kaplan, MD 06/26/12 0005  Derwood Kaplan, MD 06/26/12 0010

## 2012-06-26 MED ORDER — TETANUS-DIPHTH-ACELL PERTUSSIS 5-2.5-18.5 LF-MCG/0.5 IM SUSP
0.5000 mL | Freq: Once | INTRAMUSCULAR | Status: AC
  Administered 2012-06-26: 0.5 mL via INTRAMUSCULAR
  Filled 2012-06-26: qty 0.5

## 2012-06-26 MED ORDER — SULFAMETHOXAZOLE-TRIMETHOPRIM 800-160 MG PO TABS: 2.0000 | ORAL_TABLET | Freq: Two times a day (BID) | ORAL | Status: DC

## 2012-06-26 MED ORDER — CEPHALEXIN 500 MG PO CAPS: 500.0000 mg | ORAL_CAPSULE | Freq: Four times a day (QID) | ORAL | Status: DC

## 2012-06-26 MED ORDER — HYDROCODONE-ACETAMINOPHEN 5-325 MG PO TABS: 1.0000 | ORAL_TABLET | Freq: Four times a day (QID) | ORAL | Status: DC | PRN

## 2012-06-26 MED ORDER — IBUPROFEN 600 MG PO TABS: 600.0000 mg | ORAL_TABLET | Freq: Four times a day (QID) | ORAL | Status: DC | PRN

## 2012-06-26 NOTE — ED Notes (Signed)
Discharge paperwork given to pt; prescriptions and follow-up care discussed with pt; all of pts additional questions were answered; vital signs obtained; e-signature obtained; pt verbalized understanding of discharge and wound care;

## 2012-06-28 ENCOUNTER — Emergency Department (HOSPITAL_COMMUNITY)
Admission: EM | Admit: 2012-06-28 | Discharge: 2012-06-28 | Disposition: A | Discharge status: Home / Self Care | Payer: Self-pay | Attending: Emergency Medicine | Admitting: Emergency Medicine

## 2012-06-28 ENCOUNTER — Encounter (HOSPITAL_COMMUNITY): Payer: Self-pay | Admitting: Emergency Medicine

## 2012-06-28 DIAGNOSIS — J45909 Unspecified asthma, uncomplicated: Secondary | ICD-10-CM | POA: Insufficient documentation

## 2012-06-28 DIAGNOSIS — Z79899 Other long term (current) drug therapy: Secondary | ICD-10-CM | POA: Insufficient documentation

## 2012-06-28 DIAGNOSIS — Z4801 Encounter for change or removal of surgical wound dressing: Secondary | ICD-10-CM | POA: Insufficient documentation

## 2012-06-28 DIAGNOSIS — Z8679 Personal history of other diseases of the circulatory system: Secondary | ICD-10-CM | POA: Insufficient documentation

## 2012-06-28 DIAGNOSIS — Z5189 Encounter for other specified aftercare: Secondary | ICD-10-CM

## 2012-06-28 DIAGNOSIS — F17200 Nicotine dependence, unspecified, uncomplicated: Secondary | ICD-10-CM | POA: Insufficient documentation

## 2012-06-28 DIAGNOSIS — Z87828 Personal history of other (healed) physical injury and trauma: Secondary | ICD-10-CM | POA: Insufficient documentation

## 2012-06-28 NOTE — ED Provider Notes (Signed)
History     CSN: [id number]  Arrival date & time 06/28/12  0946   First MD Initiated Contact with Patient 06/28/12 1000      Chief Complaint  Patient presents with  . Finger Injury    (Consider location/radiation/quality/duration/timing/severity/associated sxs/prior treatment) HPI Comments: Patient is a 32 year old male who presents for wound check. He recently had a finger infection of his right thumb that was drained 3 days ago. Patient was instructed to follow up to monitor improvement. Patient reports subjective improvement of infection. He is taking Keflex and Bactrim as directed. His pain is controlled and he has no complaints at this time.    Past Medical History  Diagnosis Date  . Asthma   . Migraines     History reviewed. No pertinent past surgical history.  No family history on file.  History  Substance Use Topics  . Smoking status: Current Every Day Smoker  . Smokeless tobacco: Not on file  . Alcohol Use: Yes      Review of Systems  Skin: Positive for wound.  All other systems reviewed and are negative.    Allergies  Review of patient's allergies indicates no known allergies.  Home Medications   Current Outpatient Rx  Name  Route  Sig  Dispense  Refill  . CEPHALEXIN 500 MG PO CAPS   Oral   Take 500 mg by mouth 4 (four) times daily. Take for 10 days. First dose 06/27/2012.         Marland Kitchen HYDROCODONE-ACETAMINOPHEN 5-325 MG PO TABS   Oral   Take 1 tablet by mouth every 6 (six) hours as needed. For pain         . IBUPROFEN 600 MG PO TABS   Oral   Take 600 mg by mouth every 6 (six) hours as needed. For pain         . SULFAMETHOXAZOLE-TMP DS 800-160 MG PO TABS   Oral   Take 2 tablets by mouth 2 (two) times daily. Take for 10 days.  First dose 06/28/2011.           BP 132/78  Pulse 82  Temp 97.8 F (36.6 C) (Oral)  Resp 18  SpO2 100%  Physical Exam  Nursing note and vitals reviewed. Constitutional: He is oriented to person, place, and  time. He appears well-developed and well-nourished. No distress.  HENT:  Head: Normocephalic and atraumatic.  Eyes: Conjunctivae normal and EOM are normal.  Neck: Normal range of motion.  Cardiovascular: Normal rate and regular rhythm.  Exam reveals no gallop and no friction rub.   No murmur heard. Pulmonary/Chest: Effort normal and breath sounds normal. He has no wheezes. He has no rales. He exhibits no tenderness.  Abdominal: Soft. There is no tenderness.  Musculoskeletal:       Right thumb DIP ROM limited due to swelling and pain.   Neurological: He is alert and oriented to person, place, and time.       Speech is goal-oriented. Moves limbs without ataxia.   Skin: Skin is warm and dry.       Area of scab from previous I&D on DIP of right thumb. Area is non erythematous and nontender to palpation.   Psychiatric: He has a normal mood and affect. His behavior is normal.    ED Course  Procedures (including critical care time)  Labs Reviewed - No data to display No results found.   1. Wound check, abscess       MDM  10:42 AM The patient's finger infection appears to be healing. Patient reports subjective healing. Patient advised to continue antibiotics. Patient afebrile without signs of spreading infection. No further evaluation needed at this time.         Emilia Beck, PA-C 07/02/12 1440

## 2012-06-28 NOTE — ED Notes (Signed)
Onset one week ago right thumb injury at work seen here in ED 3 days ago patient states diagnosed with skin finger infection and here for follow up. Pain 5/10 achy dull sharp with movement.

## 2012-06-28 NOTE — ED Notes (Signed)
Pt returns to ER for follow-up regarding this past Monday's visit and I&D of right thumb. Pt unable to bend right thumb, rates pain 5/10. Compliant with antibiotics. Slight swelling noted to area, clean & dry. Appears to be healing. A&Ox4, ambulatory, nad.

## 2012-07-02 NOTE — ED Provider Notes (Signed)
Medical screening examination/treatment/procedure(s) were performed by non-physician practitioner and as supervising physician I was immediately available for consultation/collaboration.   Timothy Townsel H Truett Mcfarlan, MD 07/02/12 1502 

## 2012-09-05 ENCOUNTER — Emergency Department (HOSPITAL_COMMUNITY)
Admission: EM | Admit: 2012-09-05 | Discharge: 2012-09-05 | Disposition: A | Discharge status: Home / Self Care | Payer: Self-pay | Attending: Emergency Medicine | Admitting: Emergency Medicine

## 2012-09-05 DIAGNOSIS — F17200 Nicotine dependence, unspecified, uncomplicated: Secondary | ICD-10-CM | POA: Insufficient documentation

## 2012-09-05 DIAGNOSIS — L03019 Cellulitis of unspecified finger: Secondary | ICD-10-CM | POA: Insufficient documentation

## 2012-09-05 DIAGNOSIS — L03011 Cellulitis of right finger: Secondary | ICD-10-CM

## 2012-09-05 DIAGNOSIS — J45909 Unspecified asthma, uncomplicated: Secondary | ICD-10-CM | POA: Insufficient documentation

## 2012-09-05 DIAGNOSIS — Z8679 Personal history of other diseases of the circulatory system: Secondary | ICD-10-CM | POA: Insufficient documentation

## 2012-09-05 MED ORDER — IBUPROFEN 800 MG PO TABS: 800.0000 mg | ORAL_TABLET | Freq: Three times a day (TID) | ORAL | Status: DC | PRN

## 2012-09-05 MED ORDER — CEPHALEXIN 500 MG PO CAPS: 500.0000 mg | ORAL_CAPSULE | Freq: Four times a day (QID) | ORAL | Status: DC

## 2012-09-05 MED ORDER — IBUPROFEN 800 MG PO TABS
800.0000 mg | ORAL_TABLET | Freq: Once | ORAL | Status: AC
  Administered 2012-09-05: 800 mg via ORAL
  Filled 2012-09-05: qty 1

## 2012-09-05 MED ORDER — ACETAMINOPHEN 500 MG PO TABS
1000.0000 mg | ORAL_TABLET | Freq: Once | ORAL | Status: AC
  Administered 2012-09-05: 1000 mg via ORAL
  Filled 2012-09-05: qty 2

## 2012-09-05 NOTE — ED Provider Notes (Signed)
History     CSN: [id number]  Arrival date & time 09/05/12  0607   First MD Initiated Contact with Patient 09/05/12 [phone]      Chief Complaint  Patient presents with  . Hand Pain    right thumb swelling and pain with history of infections    (Consider location/radiation/quality/duration/timing/severity/associated sxs/prior treatment) HPI Matthew Lester is a 32 year old male who presents to the ED with right thumb pain. He had an infection and had it drained 2 months ago. He was on ABX and improved. 4 days ago, his right thumb began hurting and swelling. 2 days ago his thumb became more edematous and erythematous. He reports the pain is up to 10/10 and sharp. He denies any erythematous streaking. He denies any injury or trauma to the thumb. He does outdoor maintenance work, including cutting trees. He reports wearing gloves often while at work. He denies fever, chills, nausea, and vomiting. Past Medical History  Diagnosis Date  . Asthma   . Migraines     No past surgical history on file.  No family history on file.  History  Substance Use Topics  . Smoking status: Current Every Day Smoker  . Smokeless tobacco: Not on file  . Alcohol Use: Yes      Review of Systems All other systems negative except as documented in the HPI. All pertinent positives and negatives as reviewed in the HPI.  Allergies  Review of patient's allergies indicates no known allergies.  Home Medications  No current outpatient prescriptions on file.  BP 140/71  Temp(Src) 98.1 F (36.7 C) (Oral)  Resp 18  Ht 6\' 2"  (1.88 m)  Wt 170 lb (77.111 kg)  BMI 21.82 kg/m2  SpO2 100%  Physical Exam  Constitutional: He appears well-developed and well-nourished.  HENT:  Head: Normocephalic.  Pulmonary/Chest: Effort normal.  Musculoskeletal:       Arms: Skin: Skin is warm and dry. No rash noted.    ED Course  Procedures (including critical care time)  INCISION AND DRAINAGE Performed by:  Carlyle Dolly Consent: Verbal consent obtained. Risks and benefits: risks, benefits and alternatives were discussed Type: abscess  Body area: R thumb Paronychia  Anesthesia: local infiltration  Incision was made with a scalpel.  Local anesthetic: lidocaine 2%   Anesthetic total: 5 ml  Complexity: complex Blunt dissection to break up loculations  Drainage: purulent  Drainage amount: small   Patient tolerance: Patient tolerated the procedure well with no immediate complications.       MDM          Carlyle Dolly, PA-C 09/05/12 [phone]

## 2012-09-05 NOTE — ED Notes (Signed)
Area cleansed prior to dressing.  Sterile dressing applied.  Pt. Tolerated without difficulty

## 2012-09-05 NOTE — ED Provider Notes (Signed)
Medical screening examination/treatment/procedure(s) were conducted as a shared visit with non-physician practitioner(s) and myself.  I personally evaluated the patient during the encounter  R thumb paronychia.  Glynn Octave, MD 09/05/12 [phone]

## 2012-09-05 NOTE — ED Notes (Signed)
The patient states that he has had swelling and pain in his right thumb since 08/31/12.  He has a history of paronychia.

## 2013-05-01 ENCOUNTER — Encounter (HOSPITAL_COMMUNITY): Payer: Self-pay | Admitting: Emergency Medicine

## 2013-05-01 DIAGNOSIS — G43909 Migraine, unspecified, not intractable, without status migrainosus: Secondary | ICD-10-CM | POA: Insufficient documentation

## 2013-05-01 DIAGNOSIS — J45909 Unspecified asthma, uncomplicated: Secondary | ICD-10-CM | POA: Insufficient documentation

## 2013-05-01 DIAGNOSIS — F17200 Nicotine dependence, unspecified, uncomplicated: Secondary | ICD-10-CM | POA: Insufficient documentation

## 2013-05-01 NOTE — ED Notes (Signed)
Pt c/o HA since last night. Light hurts his eye. States pain feels like throbbing on left side. Has Hx of migraines.

## 2013-05-02 ENCOUNTER — Emergency Department (HOSPITAL_COMMUNITY)
Admission: EM | Admit: 2013-05-02 | Discharge: 2013-05-02 | Disposition: A | Discharge status: Home / Self Care | Payer: Self-pay | Attending: Emergency Medicine | Admitting: Emergency Medicine

## 2013-05-02 MED ORDER — DIPHENHYDRAMINE HCL 50 MG/ML IJ SOLN
25.0000 mg | Freq: Once | INTRAMUSCULAR | Status: DC
  Filled 2013-05-02: qty 1

## 2013-05-02 MED ORDER — SODIUM CHLORIDE 0.9 % IV BOLUS (SEPSIS)
1000.0000 mL | Freq: Once | INTRAVENOUS | Status: AC
  Administered 2013-05-02: 1000 mL via INTRAVENOUS

## 2013-05-02 MED ORDER — METOCLOPRAMIDE HCL 5 MG/ML IJ SOLN
10.0000 mg | Freq: Once | INTRAMUSCULAR | Status: AC
  Administered 2013-05-02: 10 mg via INTRAVENOUS
  Filled 2013-05-02: qty 2

## 2013-05-02 MED ORDER — IBUPROFEN 800 MG PO TABS: 800.0000 mg | ORAL_TABLET | Freq: Three times a day (TID) | ORAL | Status: DC

## 2013-05-02 MED ORDER — DIPHENHYDRAMINE HCL 50 MG/ML IJ SOLN
25.0000 mg | Freq: Once | INTRAMUSCULAR | Status: AC
  Administered 2013-05-02: 25 mg via INTRAVENOUS
  Filled 2013-05-02 (×2): qty 1

## 2013-05-02 MED ORDER — PROMETHAZINE HCL 25 MG PO TABS: 25.0000 mg | ORAL_TABLET | Freq: Four times a day (QID) | ORAL | Status: DC | PRN

## 2013-05-02 NOTE — ED Provider Notes (Signed)
CSN: [id number]     Arrival date & time 05/01/13  2139 History   First MD Initiated Contact with Patient 05/02/13 0328     Chief Complaint  Patient presents with  . Headache   (Consider location/radiation/quality/duration/timing/severity/associated sxs/prior Treatment) HPI History provided by patient. States history of migraines with ongoing migraine for the last 10 days. Headache is located left side, throbbing in quality and moderate to severe. He's been taking Goody powder at home with intermittent relief. Headache became worse last night with associated nausea vomiting. Unable to hold anything down so he came to the emergency department.  No vision changes. No fevers. No neck stiffness. Symptoms are same as his chronic recurrent migraines. No sudden onset headache. No syncope. No rash. No recent travel. No known sick contacts. Past Medical History  Diagnosis Date  . Asthma   . Migraines    No past surgical history on file. No family history on file. History  Substance Use Topics  . Smoking status: Current Every Day Smoker  . Smokeless tobacco: Not on file  . Alcohol Use: Yes    Review of Systems  Constitutional: Negative for fever and chills.  Eyes: Negative for pain.  Respiratory: Negative for shortness of breath.   Cardiovascular: Negative for chest pain.  Gastrointestinal: Positive for vomiting. Negative for abdominal pain.  Genitourinary: Negative for dysuria.  Musculoskeletal: Negative for back pain, neck pain and neck stiffness.  Skin: Negative for rash.  Neurological: Positive for headaches. Negative for seizures and speech difficulty.  All other systems reviewed and are negative.    Allergies  Review of patient's allergies indicates no known allergies.  Home Medications   Current Outpatient Rx  Name  Route  Sig  Dispense  Refill  . Aspirin-Acetaminophen-Caffeine (GOODY HEADACHE PO)   Oral   Take 1 Package by mouth daily as needed (FOR HEADACHES).          Marland Kitchen ibuprofen (ADVIL,MOTRIN) 800 MG tablet   Oral   Take 1 tablet (800 mg total) by mouth 3 (three) times daily.   21 tablet   0   . promethazine (PHENERGAN) 25 MG tablet   Oral   Take 1 tablet (25 mg total) by mouth every 6 (six) hours as needed for nausea or vomiting.   30 tablet   0    BP 104/66  Pulse 59  Temp(Src) 97.2 F (36.2 C) (Oral)  Resp 16  Wt 166 lb 4.8 oz (75.433 kg)  SpO2 100% Physical Exam  Constitutional: He is oriented to person, place, and time. He appears well-developed and well-nourished.  HENT:  Head: Normocephalic and atraumatic.  Eyes: EOM are normal. Pupils are equal, round, and reactive to light.  Neck: Normal range of motion. Neck supple.  Cardiovascular: Normal rate, regular rhythm and intact distal pulses.   Pulmonary/Chest: Effort normal and breath sounds normal. No respiratory distress.  Abdominal: Soft. Bowel sounds are normal.  Musculoskeletal: Normal range of motion. He exhibits no edema.  Neurological: He is alert and oriented to person, place, and time. He has normal reflexes. He displays normal reflexes. No cranial nerve deficit. He exhibits normal muscle tone. Coordination normal.  Skin: Skin is warm and dry.    ED Course  Procedures (including critical care time)  IV fluids. IV headache cocktail: Benadryl, Reglan, Decadron  Recheck after medications is pain-free with nausea resolved. Tolerates oral fluids and meal.  Plan discharge home with prescription for Motrin and Phenergan as needed. Outpatient referral provided. Reliable historian and  agrees to return precautions and all discharge and followup instructions.  MDM   1. Headache    Improved with IV fluids and medications Serial evaluations Signs and nursing notes reviewed and considered    Sunnie Nielsen, MD 05/02/13 [phone]

## 2013-07-11 ENCOUNTER — Emergency Department (HOSPITAL_COMMUNITY)
Admission: EM | Admit: 2013-07-11 | Discharge: 2013-07-11 | Disposition: A | Discharge status: Home / Self Care | Payer: Self-pay | Attending: Emergency Medicine | Admitting: Emergency Medicine

## 2013-07-11 ENCOUNTER — Encounter (HOSPITAL_COMMUNITY): Payer: Self-pay | Admitting: Emergency Medicine

## 2013-07-11 DIAGNOSIS — F17200 Nicotine dependence, unspecified, uncomplicated: Secondary | ICD-10-CM | POA: Insufficient documentation

## 2013-07-11 DIAGNOSIS — L03011 Cellulitis of right finger: Secondary | ICD-10-CM

## 2013-07-11 DIAGNOSIS — Z8679 Personal history of other diseases of the circulatory system: Secondary | ICD-10-CM | POA: Insufficient documentation

## 2013-07-11 DIAGNOSIS — J45909 Unspecified asthma, uncomplicated: Secondary | ICD-10-CM | POA: Insufficient documentation

## 2013-07-11 DIAGNOSIS — L03019 Cellulitis of unspecified finger: Secondary | ICD-10-CM | POA: Insufficient documentation

## 2013-07-11 MED ORDER — MUPIROCIN CALCIUM 2 % EX CREA: 1.0000 "application " | TOPICAL_CREAM | Freq: Three times a day (TID) | CUTANEOUS | Status: DC

## 2013-07-11 MED ORDER — CEPHALEXIN 500 MG PO CAPS: 500.0000 mg | ORAL_CAPSULE | Freq: Four times a day (QID) | ORAL | Status: DC

## 2013-07-11 NOTE — ED Notes (Signed)
Pt reports having right thumb pain, unsure of injury.

## 2013-07-11 NOTE — ED Provider Notes (Signed)
CSN: [id number]     Arrival date & time 07/11/13  [id number] History   First MD Initiated Contact with Patient 07/11/13 0930     Chief Complaint  Patient presents with  . Hand Pain   (Consider location/radiation/quality/duration/timing/severity/associated sxs/prior Treatment) HPI  Pt presents with right thumb pain.  Pt states he has a history of paronychia with I/D twice in the past.  He has felt the pain coming on since Tuesday, and hoped to catch it early enough to not need I/D.  Pt denies N/V/D, ABD pain, fever, or chills. Pt also denies numbness, tingling, or weakness to hand.  Past Medical History  Diagnosis Date  . Asthma   . Migraines    History reviewed. No pertinent past surgical history. History reviewed. No pertinent family history. History  Substance Use Topics  . Smoking status: Current Every Day Smoker  . Smokeless tobacco: Not on file  . Alcohol Use: Yes    Review of Systems  Constitutional: Negative for fever, chills and diaphoresis.  Gastrointestinal: Negative for nausea, vomiting, abdominal pain and diarrhea.  Musculoskeletal: Negative for arthralgias.  Neurological: Negative for weakness and numbness.    Allergies  Review of patient's allergies indicates no known allergies.  Home Medications  No current outpatient prescriptions on file. BP 135/76  Pulse 63  Temp(Src) 97.8 F (36.6 C) (Oral)  Resp 18  Wt 168 lb 9 oz (76.459 kg)  SpO2 100% Physical Exam  General:  WDWN in NAD Heart: No MGR noted. Lungs: Clear to auscultation A/P bilaterally. Wrist:  Non Tender, Full ROM. 2+ Pulse Hand:  Full ROM, sensation in tact.  Left thumb has mild erythema and edema that starts at the tip but does not progress to the interphalangeal joint. Left thumb also has a small amount of ecchymosis in proximal nail bed. No joint tenderness.   ED Course  Procedures (including critical care time)  10:10 AM Early sign of paronychia, not requiring I&D at this time.  Will give  mupirocin, keflex, soak finger in warm water with soap and return precaution given.    Labs Review Labs Reviewed - No data to display Imaging Review No results found.  EKG Interpretation   None       MDM   1. Acute paronychia of right thumb    BP 135/76  Pulse 63  Temp(Src) 97.8 F (36.6 C) (Oral)  Resp 18  Wt 168 lb 9 oz (76.459 kg)  SpO2 100%     Fayrene HelperBowie Mattingly F[place]aine, PA-C 07/11/13 1011

## 2013-07-11 NOTE — ED Notes (Signed)
Pt called in main ED waiting area with no response 

## 2013-07-11 NOTE — ED Provider Notes (Signed)
  Medical screening examination/treatment/procedure(s) were performed by non-physician practitioner and as supervising physician I was immediately available for consultation/collaboration.      Kaetlin Bullen, MD 07/11/13 1052 

## 2013-07-11 NOTE — Discharge Instructions (Signed)
Apply mupirocin to affected area 3 times daily for the next 5 days.  Also soak finger in luke warm water with dial antibacterial soap several times daily.  Take antibiotic if you notice no improvement after 2 days.  Return to ER if symptoms worsen.  Paronychia Paronychia is an inflammatory reaction involving the folds of the skin surrounding the fingernail. This is commonly caused by an infection in the skin around a nail. The most common cause of paronychia is frequent wetting of the hands (as seen with bartenders, food servers, nurses or others who wet their hands). This makes the skin around the fingernail susceptible to infection by bacteria (germs) or fungus. Other predisposing factors are:  Aggressive manicuring.  Nail biting.  Thumb sucking. The most common cause is a staphylococcal (a type of germ) infection, or a fungal (Candida) infection. When caused by a germ, it usually comes on suddenly with redness, swelling, pus and is often painful. It may get under the nail and form an abscess (collection of pus), or form an abscess around the nail. If the nail itself is infected with a fungus, the treatment is usually prolonged and may require oral medicine for up to one year. Your caregiver will determine the length of time treatment is required. The paronychia caused by bacteria (germs) may largely be avoided by not pulling on hangnails or picking at cuticles. When the infection occurs at the tips of the finger it is called felon. When the cause of paronychia is from the herpes simplex virus (HSV) it is called herpetic whitlow. TREATMENT  When an abscess is present treatment is often incision and drainage. This means that the abscess must be cut open so the pus can get out. When this is done, the following home care instructions should be followed. HOME CARE INSTRUCTIONS   It is important to keep the affected fingers very dry. Rubber or plastic gloves over cotton gloves should be used whenever the  hand must be placed in water.  Keep wound clean, dry and dressed as suggested by your caregiver between warm soaks or warm compresses.  Soak in warm water for fifteen to twenty minutes three to four times per day for bacterial infections. Fungal infections are very difficult to treat, so often require treatment for long periods of time.  For bacterial (germ) infections take antibiotics (medicine which kill germs) as directed and finish the prescription, even if the problem appears to be solved before the medicine is gone.  Only take over-the-counter or prescription medicines for pain, discomfort, or fever as directed by your caregiver. SEEK IMMEDIATE MEDICAL CARE IF:  You have redness, swelling, or increasing pain in the wound.  You notice pus coming from the wound.  You have a fever.  You notice a bad smell coming from the wound or dressing. Document Released: 11/30/2000 Document Revised: 08/29/2011 Document Reviewed: 08/01/2008 [facility] Patient Information 2014 [place], [place].

## 2013-07-11 NOTE — ED Notes (Signed)
C/O pain right thumb around cuticle. Hx of multiple "infections" in that finger. States he "came early this time". Has had to have I&D in the past.

## 2013-08-22 ENCOUNTER — Encounter (HOSPITAL_COMMUNITY): Payer: Self-pay | Admitting: Emergency Medicine

## 2013-08-22 ENCOUNTER — Emergency Department (HOSPITAL_COMMUNITY)
Admission: EM | Admit: 2013-08-22 | Discharge: 2013-08-22 | Disposition: A | Discharge status: Home / Self Care | Payer: Self-pay | Attending: Emergency Medicine | Admitting: Emergency Medicine

## 2013-08-22 DIAGNOSIS — F17200 Nicotine dependence, unspecified, uncomplicated: Secondary | ICD-10-CM | POA: Insufficient documentation

## 2013-08-22 DIAGNOSIS — G5601 Carpal tunnel syndrome, right upper limb: Secondary | ICD-10-CM

## 2013-08-22 DIAGNOSIS — G5600 Carpal tunnel syndrome, unspecified upper limb: Secondary | ICD-10-CM | POA: Insufficient documentation

## 2013-08-22 DIAGNOSIS — G43909 Migraine, unspecified, not intractable, without status migrainosus: Secondary | ICD-10-CM | POA: Insufficient documentation

## 2013-08-22 NOTE — ED Provider Notes (Signed)
CSN: [id number]     Arrival date & time 08/22/13  1730 History   First MD Initiated Contact with Patient 08/22/13 2016     Chief Complaint  Patient presents with  . Tingling     (Consider location/radiation/quality/duration/timing/severity/associated sxs/prior Treatment) The history is provided by the patient.   33 year old male has had tingling in his right arm for the last 2 weeks. Tingling mainly comes at night and wakes him up and then he can go back to sleep. It is across the fingers in his right hand worst in the fourth finger. He also sometimes has numbness during the day. He is noted that his right hand is slightly weaker than his left and this is unusual because he is right-handed. He denies any pain per se. He had been taking naproxen in the morning and he seemed to feel okay but he was worried that the proximals causing the numbness or stop taking it. 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 He denies any neck pain and denies any numbness or tingling anywhere else.  Past Medical History  Diagnosis Date  . Migraines    History reviewed. No pertinent past surgical history. No family history on file. History  Substance Use Topics  . Smoking status: Current Every Day Smoker  . Smokeless tobacco: Not on file  . Alcohol Use: Yes    Review of Systems  All other systems reviewed and are negative.      Allergies  Review of patient's allergies indicates no known allergies.  Home Medications   Current Outpatient Rx  Name  Route  Sig  Dispense  Refill  . Aspirin-Salicylamide-Caffeine (BC HEADACHE POWDER PO)   Oral   Take 1 packet by mouth daily as needed (pain).         . naproxen (NAPROSYN) 500 MG tablet   Oral   Take 500 mg by mouth 2 (two) times daily with a meal.          BP 125/77  Pulse 65  Temp(Src) 98.2 F (36.8 C) (Oral)  Resp 18  SpO2 97% Physical Exam  Nursing note and vitals reviewed.  33 year old male, resting  comfortably and in no acute distress. Vital signs are normal. Oxygen saturation is 97%, which is normal. Head is normocephalic and atraumatic. PERRLA, EOMI. Oropharynx is clear. Neck is nontender and supple without adenopathy or JVD. Back is nontender and there is no CVA tenderness. Lungs are clear without rales, wheezes, or rhonchi. Chest is nontender. Heart has regular rate and rhythm without murmur. Abdomen is soft, flat, nontender without masses or hepatosplenomegaly and peristalsis is normoactive. Extremities have no cyanosis or edema, full range of motion is present. Tinel sign is negative. Skin is warm and dry without rash. Neurologic: Mental status is normal, cranial nerves are intact. There is slight decreased pin prick sensation in the right fourth finger. There is slight weakness of pinch or grasp on the right compared with the left..   ED Course  Procedures (including critical care time)  MDM   Final diagnoses:  Carpal tunnel syndrome of right wrist    Right hand weakness and numbness most consistent with carpal tunnel syndrome. He is placed in a wrist splint which he is to wear at night and use during the daytime as needed. He is advised to resume taking naproxen 500 mg twice a day and is referred to hand surgery for followup.    Dione Boozeavid Nazly Digilio, MD 08/22/13  2036 

## 2013-08-22 NOTE — Discharge Instructions (Signed)
Wear the wrist splint at night while you are sleeping. You may wear it during the daytime as needed.  Take two Naproxen tablets at a time, twice a day (four tablets a day).   Carpal Tunnel Syndrome The carpal tunnel is a narrow area located on the palm side of your wrist. The tunnel is formed by the wrist bones and ligaments. Nerves, blood vessels, and tendons pass through the carpal tunnel. Repeated wrist motion or certain diseases may cause swelling within the tunnel. This swelling pinches the main nerve in the wrist (median nerve) and causes the painful hand and arm condition called carpal tunnel syndrome. CAUSES   Repeated wrist motions.  Wrist injuries.  Certain diseases like arthritis, diabetes, alcoholism, hyperthyroidism, and kidney failure.  Obesity.  Pregnancy. SYMPTOMS   A "pins and needles" feeling in your fingers or hand.  Tingling or numbness in your fingers or hand.  An aching feeling in your entire arm.  Wrist pain that goes up your arm to your shoulder.  Pain that goes down into your palm or fingers.  A weak feeling in your hands. DIAGNOSIS  Your caregiver will take your history and perform a physical exam. An electromyography test may be needed. This test measures electrical signals sent out by the muscles. The electrical signals are usually slowed by carpal tunnel syndrome. You may also need X-rays. TREATMENT  Carpal tunnel syndrome may clear up by itself. Your caregiver may recommend a wrist splint or medicine such as a nonsteroidal anti-inflammatory medicine. Cortisone injections may help. Sometimes, surgery may be needed to free the pinched nerve.  HOME CARE INSTRUCTIONS   Take all medicine as directed by your caregiver. Only take over-the-counter or prescription medicines for pain, discomfort, or fever as directed by your caregiver.  If you were given a splint to keep your wrist from bending, wear it as directed. It is important to wear the splint at  night. Wear the splint for as long as you have pain or numbness in your hand, arm, or wrist. This may take 1 to 2 months.  Rest your wrist from any activity that may be causing your pain. If your symptoms are work-related, you may need to talk to your employer about changing to a job that does not require using your wrist.  Put ice on your wrist after long periods of wrist activity.  Put ice in a plastic bag.  Place a towel between your skin and the bag.  Leave the ice on for 15-20 minutes, 03-04 times a day.  Keep all follow-up visits as directed by your caregiver. This includes any orthopedic referrals, physical therapy, and rehabilitation. Any delay in getting necessary care could result in a delay or failure of your condition to heal. SEEK IMMEDIATE MEDICAL CARE IF:   You have new, unexplained symptoms.  Your symptoms get worse and are not helped or controlled with medicines. MAKE SURE YOU:   Understand these instructions.  Will watch your condition.  Will get help right away if you are not doing well or get worse. Document Released: 06/03/2000 Document Revised: 08/29/2011 Document Reviewed: 04/22/2011 [facility] Patient Information 2014 [place], [place].

## 2013-08-22 NOTE — ED Notes (Signed)
Pt st's he has been having tingling in right arm, hand and fingers for over 1 week.  St's worse in the am when he wakes up.

## 2013-08-22 NOTE — Progress Notes (Signed)
Orthopedic Tech Progress Note Patient Details:  Matthew ChinRodney J Cincotta [birth year] [id number]  Ortho Devices Type of Ortho Device: Velcro wrist splint Ortho Device/Splint Location: RUE Ortho Device/Splint Interventions: Ordered;Application   Jennye MoccasinHughes, Ignazio Kincaid Craig 08/22/2013, 8:54 PM

## 2013-08-22 NOTE — ED Notes (Signed)
Pt reports numbness and tingling in arm 10/10 upon waking up for the past week

## 2014-01-23 ENCOUNTER — Emergency Department (HOSPITAL_COMMUNITY)
Admission: EM | Admit: 2014-01-23 | Discharge: 2014-01-23 | Disposition: A | Discharge status: Home / Self Care | Payer: Self-pay | Attending: Emergency Medicine | Admitting: Emergency Medicine

## 2014-01-23 ENCOUNTER — Encounter (HOSPITAL_COMMUNITY): Payer: Self-pay | Admitting: Emergency Medicine

## 2014-01-23 DIAGNOSIS — Z8679 Personal history of other diseases of the circulatory system: Secondary | ICD-10-CM | POA: Insufficient documentation

## 2014-01-23 DIAGNOSIS — M25519 Pain in unspecified shoulder: Secondary | ICD-10-CM | POA: Insufficient documentation

## 2014-01-23 DIAGNOSIS — R5383 Other fatigue: Secondary | ICD-10-CM

## 2014-01-23 DIAGNOSIS — R5381 Other malaise: Secondary | ICD-10-CM | POA: Insufficient documentation

## 2014-01-23 DIAGNOSIS — M5412 Radiculopathy, cervical region: Secondary | ICD-10-CM | POA: Insufficient documentation

## 2014-01-23 DIAGNOSIS — Z791 Long term (current) use of non-steroidal anti-inflammatories (NSAID): Secondary | ICD-10-CM | POA: Insufficient documentation

## 2014-01-23 MED ORDER — IBUPROFEN 800 MG PO TABS: 800.0000 mg | ORAL_TABLET | Freq: Three times a day (TID) | ORAL | Status: DC

## 2014-01-23 MED ORDER — HYDROCODONE-ACETAMINOPHEN 5-325 MG PO TABS: 1.0000 | ORAL_TABLET | Freq: Four times a day (QID) | ORAL | Status: DC | PRN

## 2014-01-23 MED ORDER — CYCLOBENZAPRINE HCL 5 MG PO TABS: 5.0000 mg | ORAL_TABLET | Freq: Three times a day (TID) | ORAL | Status: DC | PRN

## 2014-01-23 MED ORDER — PREDNISONE 20 MG PO TABS: 60.0000 mg | ORAL_TABLET | Freq: Every day | ORAL | Status: DC

## 2014-01-23 NOTE — ED Provider Notes (Signed)
CSN: [id number]     Arrival date & time 01/23/14  0703 History   First MD Initiated Contact with Patient 01/23/14 [phone]     Chief Complaint  Patient presents with  . Shoulder Pain     (Consider location/radiation/quality/duration/timing/severity/associated sxs/prior Treatment) Patient is a 33 y.o. male presenting with shoulder pain.  Shoulder Pain Associated symptoms include arthralgias and weakness. Pertinent negatives include no abdominal pain, chest pain, chills, diaphoresis, fatigue, fever, nausea, numbness or vomiting.    Matthew Lester is a 33 year old man with no significant past medical history presenting with 9 days of L shoulder pain. 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 He rates it as an intermittent 7-8/10 that is sharp and non radiating. It involves the entire deltoid muscle. He denies any acute trauma. He thinks it is worse when he is resting and better when he is active ie lifting light weights at the gym. He says that alleve has helped some.   Past Medical History  Diagnosis Date  . Migraines    History reviewed. No pertinent past surgical history. No family history on file. History  Substance Use Topics  . Smoking status: Never Smoker   . Smokeless tobacco: Not on file  . Alcohol Use: Yes    Review of Systems  Constitutional: Negative for fever, chills, diaphoresis and fatigue.  Respiratory: Negative for shortness of breath.   Cardiovascular: Negative for chest pain and palpitations.  Gastrointestinal: Negative for nausea, vomiting, abdominal pain, diarrhea and constipation.  Musculoskeletal: Positive for arthralgias.  Neurological: Positive for weakness. Negative for syncope, light-headedness and numbness.      Allergies  Review of patient's allergies indicates no known allergies.  Home Medications   Prior to Admission medications   Medication Sig Start Date End Date Taking?  Authorizing Provider  Aspirin-Salicylamide-Caffeine (BC HEADACHE POWDER PO) Take 1 packet by mouth daily as needed (pain).    Historical Provider, MD  naproxen (NAPROSYN) 500 MG tablet Take 500 mg by mouth 2 (two) times daily with a meal.    Historical Provider, MD   BP 143/71  Pulse 64  Temp(Src) 98.7 F (37.1 C) (Oral)  Resp 18  Ht 6\' 1"  (1.854 m)  Wt 170 lb (77.111 kg)  BMI 22.43 kg/m2  SpO2 100% Physical Exam  Constitutional: He is oriented to person, place, and time. He appears well-developed and well-nourished. No distress.  HENT:  Head: Normocephalic and atraumatic.  Mouth/Throat: Oropharynx is clear and moist.  Eyes: EOM are normal. Pupils are equal, round, and reactive to light.  Neck: Normal range of motion.  Cardiovascular: Normal rate and regular rhythm.  Exam reveals friction rub. Exam reveals no gallop.   No murmur heard. Pulmonary/Chest: Effort normal and breath sounds normal. No respiratory distress. He has no wheezes. He has no rales. He exhibits no tenderness.  Musculoskeletal: Normal range of motion. He exhibits no edema and no tenderness.  Neurological: He is alert and oriented to person, place, and time. No cranial nerve deficit. He exhibits normal muscle tone. Coordination normal.  Skin: He is not diaphoretic.    ED Course  Procedures (including critical care time) Labs Review Labs Reviewed - No data to display  Imaging Review No results found.   EKG Interpretation None      MDM   Final diagnoses:  None    Matthew Lester likely pulled a muscle. He has full ROM on exam, no  evidence of trauma, equal strength bilaterally with no signs of rotator cuff tear. He says the pain is relieved by tilting his neck to the left suggesting a cervical radiculopathy. He says the pain is better with muscle use and worse when resting which is unusual. Films were not obtained as he denied any trauma. He will be discharged with prednisone 60 mg po daily x 5 days as well as   ibuprofen 800 TID x 1 week, flexeril 5 mg TIDPRN x 1 week, and vicodin 1 tab Q6HPRN x 10 tabs for breakthrough pain.    Lorenda HatchetAdam L Aleeya Veitch, MD 01/23/14 [phone]

## 2014-01-23 NOTE — ED Notes (Signed)
Pt c/o left shoulder pain x 2 weeks, sts he was working on a house when the pain started, sts he thinks he pulled something or lifted something too heavy. Pt tried some OTC pain meds and got some relief but the pain is still there. Nad, skin warm and dry, resp e/u.

## 2014-01-23 NOTE — ED Provider Notes (Signed)
I saw and evaluated the patient, reviewed the resident's note and I agree with the findings and plan.   EKG Interpretation None      Here with L shoulder pain. Tingling and pain alleviated with elevation of arm above the head, c/w cervical radiculopathy. Given steroids, pain meds, muscle relaxers. NVI on exam. Stable for discharge.  Elwin MochaBlair Dare Sanger, MD 01/23/14 1455

## 2014-01-23 NOTE — Discharge Instructions (Signed)
Please take your prednisone 60 mg daily x 5 days as well as ibuprofen 800 three times a day x 1 week, flexeril 5 mg three times a day as needed x 1 week, and vicodin 1 tab every six hours as needed x 10 tabs for breakthrough pain. You should schedule a follow-up appointment with your PCP for one week to evaluate whether the pain has improved. Please seek medical attention or return to ED if you have any new or worsening pain, numbness, tingling, or other worrisome medical issues.

## 2014-02-07 ENCOUNTER — Emergency Department (HOSPITAL_COMMUNITY)
Admission: EM | Admit: 2014-02-07 | Discharge: 2014-02-07 | Disposition: A | Discharge status: Home / Self Care | Payer: Self-pay | Attending: Emergency Medicine | Admitting: Emergency Medicine

## 2014-02-07 ENCOUNTER — Emergency Department (HOSPITAL_COMMUNITY): Payer: Self-pay

## 2014-02-07 ENCOUNTER — Encounter (HOSPITAL_COMMUNITY): Payer: Self-pay | Admitting: Emergency Medicine

## 2014-02-07 DIAGNOSIS — M25519 Pain in unspecified shoulder: Secondary | ICD-10-CM | POA: Insufficient documentation

## 2014-02-07 DIAGNOSIS — Z87891 Personal history of nicotine dependence: Secondary | ICD-10-CM | POA: Insufficient documentation

## 2014-02-07 DIAGNOSIS — Z8669 Personal history of other diseases of the nervous system and sense organs: Secondary | ICD-10-CM | POA: Insufficient documentation

## 2014-02-07 DIAGNOSIS — M25512 Pain in left shoulder: Secondary | ICD-10-CM

## 2014-02-07 MED ORDER — MELOXICAM 15 MG PO TABS: 15.0000 mg | ORAL_TABLET | Freq: Every day | ORAL | Status: DC

## 2014-02-07 MED ORDER — OXYCODONE-ACETAMINOPHEN 5-325 MG PO TABS: 1.0000 | ORAL_TABLET | Freq: Four times a day (QID) | ORAL | Status: DC | PRN

## 2014-02-07 NOTE — ED Notes (Signed)
The patient said he was seen here for the same complaint and was given medications and sent home.  He has returned because it is not working.  He is still in pain.  The patient is back to be evaluated.  The patient rates his pain 8/10.

## 2014-02-07 NOTE — Discharge Instructions (Signed)
Do not take any ibuprofen, advil, aleve, or any other type of antiinflammatory medicine with Mobic.  Always take it with food.  Do not drive or operate heavy machinery with percocet use.  Follow-up with Orthopedics.    Shoulder Pain The shoulder is the joint that connects your arms to your body. The bones that form the shoulder joint include the upper arm bone (humerus), the shoulder blade (scapula), and the collarbone (clavicle). The top of the humerus is shaped like a ball and fits into a rather flat socket on the scapula (glenoid cavity). A combination of muscles and strong, fibrous tissues that connect muscles to bones (tendons) support your shoulder joint and hold the ball in the socket. Small, fluid-filled sacs (bursae) are located in different areas of the joint. They act as cushions between the bones and the overlying soft tissues and help reduce friction between the gliding tendons and the bone as you move your arm. Your shoulder joint allows a wide range of motion in your arm. This range of motion allows you to do things like scratch your back or throw a ball. However, this range of motion also makes your shoulder more prone to pain from overuse and injury. Causes of shoulder pain can originate from both injury and overuse and usually can be grouped in the following four categories:  Redness, swelling, and pain (inflammation) of the tendon (tendinitis) or the bursae (bursitis).  Instability, such as a dislocation of the joint.  Inflammation of the joint (arthritis).  Broken bone (fracture). HOME CARE INSTRUCTIONS   Apply ice to the sore area.  Put ice in a plastic bag.  Place a towel between your skin and the bag.  Leave the ice on for 15-20 minutes, 3-4 times per day for the first 2 days, or as directed by your health care provider.  Stop using cold packs if they do not help with the pain.  If you have a shoulder sling or immobilizer, wear it as long as your caregiver instructs.  Only remove it to shower or bathe. Move your arm as little as possible, but keep your hand moving to prevent swelling.  Squeeze a soft ball or foam pad as much as possible to help prevent swelling.  Only take over-the-counter or prescription medicines for pain, discomfort, or fever as directed by your caregiver. SEEK MEDICAL CARE IF:   Your shoulder pain increases, or new pain develops in your arm, hand, or fingers.  Your hand or fingers become cold and numb.  Your pain is not relieved with medicines. SEEK IMMEDIATE MEDICAL CARE IF:   Your arm, hand, or fingers are numb or tingling.  Your arm, hand, or fingers are significantly swollen or turn white or blue. MAKE SURE YOU:   Understand these instructions.  Will watch your condition.  Will get help right away if you are not doing well or get worse. Document Released: 03/16/2005 Document Revised: 10/21/2013 Document Reviewed: 05/21/2011 [facility] Patient Information 2015 [place], [place]. This information is not intended to replace advice given to you by your health care provider. Make sure you discuss any questions you have with your health care provider.

## 2014-02-07 NOTE — ED Provider Notes (Signed)
CSN: [id number]     Arrival date & time 02/07/14  0413 History   First MD Initiated Contact with Patient 02/07/14 0601     Chief Complaint  Patient presents with  . Shoulder Pain    The patient said he was seen here for the same complaint and was given medications and sent home.  He has returned because it is not working.  He is still in pain.   Patient is a 33 y.o. male presenting with shoulder pain.  Shoulder Pain    Patient is a 33 y.o. Male who presents to the ED with left shoulder pain x 1 month.  Patient states that he feels that he may have injured it at work.  His pain is constant 7/10 pain which is non-radiating.  Patient has been seen and evaluated here for the same problem.  Patient has tried aleve at home, and was given hydrocodone 5/325, prednisone, ibuprofen, and flexeril here with no relief of his symptoms.  His pain is worsened by forward flexion.  He has found no relieving factors.  Patient has no previous injury to this shoulder.  He denies fever, chills, nausea, vomiting, shortness of breath, chest pain, red hot joint, or inability to move his joint, tingling, numbness.  All other ROS are negative.   Past Medical History  Diagnosis Date  . Migraines    History reviewed. No pertinent past surgical history. History reviewed. No pertinent family history. History  Substance Use Topics  . Smoking status: Former Games developer  . Smokeless tobacco: Never Used  . Alcohol Use: No    Review of Systems  See HPI  Allergies  Review of patient's allergies indicates no known allergies.  Home Medications   Prior to Admission medications   Medication Sig Start Date End Date Taking? Authorizing Provider  meloxicam (MOBIC) 15 MG tablet Take 1 tablet (15 mg total) by mouth daily. 02/07/14   Brantley Wiley A Forcucci, PA-C  oxyCODONE-acetaminophen (PERCOCET/ROXICET) 5-325 MG per tablet Take 1 tablet by mouth every 6 (six) hours as needed for moderate pain or severe pain. 02/07/14   Dimarco Minkin A  Forcucci, PA-C   BP 126/80  Pulse 56  Temp(Src) 97.4 F (36.3 C) (Oral)  Resp 16  Ht 6\' 1"  (1.854 m)  Wt 170 lb (77.111 kg)  BMI 22.43 kg/m2  SpO2 97% Physical Exam  Nursing note and vitals reviewed. Constitutional: He is oriented to person, place, and time. He appears well-developed and well-nourished. No distress.  HENT:  Head: Normocephalic and atraumatic.  Mouth/Throat: Oropharynx is clear and moist. No oropharyngeal exudate.  Eyes: Conjunctivae are normal. No scleral icterus.  Neck: Normal range of motion. Neck supple. No JVD present. No thyromegaly present.  Cardiovascular: Normal rate, regular rhythm, normal heart sounds and intact distal pulses.  Exam reveals no gallop and no friction rub.   No murmur heard. Pulmonary/Chest: Effort normal and breath sounds normal. No respiratory distress. He has no wheezes. He has no rales. He exhibits no tenderness.  Musculoskeletal: Normal range of motion.       Left shoulder: He exhibits tenderness and pain. He exhibits normal range of motion, no bony tenderness, no swelling, no effusion, no crepitus, no deformity, no laceration, no spasm, normal pulse and normal strength.       Left hand: He exhibits normal capillary refill. Normal sensation noted. Decreased sensation is not present in the ulnar distribution, is not present in the medial redistribution and is not present in the radial distribution. Normal strength  noted. He exhibits no finger abduction, no thumb/finger opposition and no wrist extension trouble.  Tenderness to palpation over the long head of the biceps tendon.  There is full active ROM.  5/5 strength in all motions.  Negative empty can.  Positive speeds test.  Lymphadenopathy:    He has no cervical adenopathy.  Neurological: He is alert and oriented to person, place, and time.  Skin: Skin is warm and dry. He is not diaphoretic.  Psychiatric: He has a normal mood and affect. His behavior is normal. Judgment and thought content  normal.    ED Course  Procedures (including critical care time) Labs Review Labs Reviewed - No data to display  Imaging Review Dg Shoulder Left  02/07/2014   CLINICAL DATA:  Persistent left shoulder pain for 1 month.  EXAM: LEFT SHOULDER - 2+ VIEW  COMPARISON:  None available for comparison at time of study interpretation.  FINDINGS: The humeral head is well-formed and located. The subacromial, glenohumeral and acromioclavicular joint spaces are intact. Mild acromioclavicular undersurface spurring. No destructive bony lesions. Soft tissue planes are non-suspicious.  IMPRESSION: No acute fracture deformity or dislocation.  Mild acromioclavicular undersurface spurring can be associated with impingement.   Electronically Signed   By: Awilda Metroourtnay  Bloomer   On: 02/07/2014 06:05     EKG Interpretation None      MDM   Final diagnoses:  Left shoulder pain   Patient is a 33 y.o. Male who presents to the ED with left shoulder pain x 1 month.  Patient has full ROM, but tenderness to the biceps tendon.  Suspect that the patient likely has biceps tendonitis.  Patient has not been evaluated at this time by orthopedics.  Will start the patient on 15 mg Mobic QD AC and will give #10 5/325 percocet for pain relief.  Patient is to follow-up with ortho for further workup at this time.   Doubt septic joint at this time.  During this visit I reviewed previous plain film xray taken two weeks ago which was negative.  Patient was told to return to the ED for septic joint symptoms.  Patient is stable for discharge at this time.  Patient states understanding and agreement to the above plan.  I have spoken with Dr. Effie ShyWentz at this time about the patient and he is aware of the above plan and workup.      Eben Burowourtney A Forcucci, PA-C 02/07/14 [id number]

## 2014-02-07 NOTE — ED Provider Notes (Signed)
Medical screening examination/treatment/procedure(s) were performed by non-physician practitioner and as supervising physician I was immediately available for consultation/collaboration.  Flint MelterElliott L Sanaz Scarlett, MD 02/07/14 [phone]

## 2014-10-01 ENCOUNTER — Emergency Department (HOSPITAL_COMMUNITY)
Admission: EM | Admit: 2014-10-01 | Discharge: 2014-10-01 | Disposition: A | Discharge status: Home / Self Care | Payer: Self-pay | Attending: Emergency Medicine | Admitting: Emergency Medicine

## 2014-10-01 ENCOUNTER — Encounter (HOSPITAL_COMMUNITY): Payer: Self-pay | Admitting: *Deleted

## 2014-10-01 DIAGNOSIS — K0889 Other specified disorders of teeth and supporting structures: Secondary | ICD-10-CM

## 2014-10-01 DIAGNOSIS — Z791 Long term (current) use of non-steroidal anti-inflammatories (NSAID): Secondary | ICD-10-CM | POA: Insufficient documentation

## 2014-10-01 DIAGNOSIS — Z8679 Personal history of other diseases of the circulatory system: Secondary | ICD-10-CM | POA: Insufficient documentation

## 2014-10-01 DIAGNOSIS — Z720 Tobacco use: Secondary | ICD-10-CM | POA: Insufficient documentation

## 2014-10-01 DIAGNOSIS — K088 Other specified disorders of teeth and supporting structures: Secondary | ICD-10-CM | POA: Insufficient documentation

## 2014-10-01 MED ORDER — AMOXICILLIN 500 MG PO CAPS: 500.0000 mg | ORAL_CAPSULE | Freq: Three times a day (TID) | ORAL | Status: DC

## 2014-10-01 MED ORDER — IBUPROFEN 600 MG PO TABS: 600.0000 mg | ORAL_TABLET | Freq: Four times a day (QID) | ORAL | Status: DC | PRN

## 2014-10-01 NOTE — Discharge Instructions (Signed)
Ibuprofen for pain. Amoxil for possible infection. Please call dr. Mayford Knifeurner within two days and follow up for further evaluation.    Dental Pain A tooth ache may be caused by cavities (tooth decay). Cavities expose the nerve of the tooth to air and hot or cold temperatures. It may come from an infection or abscess (also called a boil or furuncle) around your tooth. It is also often caused by dental caries (tooth decay). This causes the pain you are having. DIAGNOSIS  Your caregiver can diagnose this problem by exam. TREATMENT   If caused by an infection, it may be treated with medications which kill germs (antibiotics) and pain medications as prescribed by your caregiver. Take medications as directed.  Only take over-the-counter or prescription medicines for pain, discomfort, or fever as directed by your caregiver.  Whether the tooth ache today is caused by infection or dental disease, you should see your dentist as soon as possible for further care. SEEK MEDICAL CARE IF: The exam and treatment you received today has been provided on an emergency basis only. This is not a substitute for complete medical or dental care. If your problem worsens or new problems (symptoms) appear, and you are unable to meet with your dentist, call or return to this location. SEEK IMMEDIATE MEDICAL CARE IF:   You have a fever.  You develop redness and swelling of your face, jaw, or neck.  You are unable to open your mouth.  You have severe pain uncontrolled by pain medicine. MAKE SURE YOU:   Understand these instructions.  Will watch your condition.  Will get help right away if you are not doing well or get worse. Document Released: 06/06/2005 Document Revised: 08/29/2011 Document Reviewed: 01/23/2008 [facility] Patient Information 2015 [place]ExitCare, [place]LL[place]. This information is not intended to replace advice given to you by your health care provider. Make sure you discuss any questions you have with your health  care provider.

## 2014-10-01 NOTE — ED Provider Notes (Signed)
CSN: [id number]     Arrival date & time 10/01/14  2149 History  This chart was scribed for non-physician practitioner, Jaynie Crumbleatyana Marliyah Reid, PA-C, working with Cathren LaineKevin Steinl, MD, by Lionel DecemberHatice Demirci, ED Scribe. This patient was seen in room TR04C/TR04C and the patient's care was started at 11:19 PM.    Chief Complaint  Patient presents with  . Dental Pain     (Consider location/radiation/quality/duration/timing/severity/associated sxs/prior Treatment) Patient is a 34 y.o. male presenting with tooth pain. The history is provided by the patient. No language interpreter was used.  Dental Pain Associated symptoms: no fever     HPI Comments: Matthew Lester is a 34 y.o. male who presents to the Emergency Department complaining of right bottom central incisor pain onset yesterday.  Patient has associated symptoms of tenderness and notes he has had dental pain in the past.  Patient had his past dental pain fixed by a dentist.  [facility] has [facility] but denies any relief. Patient has not yet contacted a dentist.  Patient has no other complaints today.      Past Medical History  Diagnosis Date  . Migraines    History reviewed. No pertinent past surgical history. No family history on file. History  Substance Use Topics  . Smoking status: Current Every Day Smoker  . Smokeless tobacco: Never Used  . Alcohol Use: No    Review of Systems  Constitutional: Negative for fever and chills.  HENT: Positive for dental problem. Negative for nosebleeds.   Eyes: Negative for pain and redness.  Gastrointestinal: Negative for nausea and vomiting.      Allergies  Review of patient's allergies indicates no known allergies.  Home Medications   Prior to Admission medications   Medication Sig Start Date End Date Taking? Authorizing Provider  meloxicam (MOBIC) 15 MG tablet Take 1 tablet (15 mg total) by mouth daily. 02/07/14   Courtney Forcucci, PA-C  oxyCODONE-acetaminophen (PERCOCET/ROXICET) 5-325  MG per tablet Take 1 tablet by mouth every 6 (six) hours as needed for moderate pain or severe pain. 02/07/14   Courtney Forcucci, PA-C   BP 144/75 mmHg  Pulse 62  Temp(Src) 98.2 F (36.8 C)  Ht 6\' 1"  (1.854 m)  Wt 175 lb (79.379 kg)  BMI 23.09 kg/m2  SpO2 100% Physical Exam  Constitutional: He is oriented to person, place, and time. He appears well-developed and well-nourished. No distress.  HENT:  Head: Normocephalic and atraumatic.  Mouth/Throat:    Tender to palpation over right lower lateral incisor. No obvious cavity. Tooth is not loose. No surrounding gum swelling or erythema. No trismus, no swelling in the tongue. No facial swelling  Eyes: Conjunctivae and EOM are normal.  Neck: Neck supple. No tracheal deviation present.  Cardiovascular: Normal rate.   Pulmonary/Chest: Effort normal. No respiratory distress.  Musculoskeletal: Normal range of motion.  Neurological: He is alert and oriented to person, place, and time.  Skin: Skin is warm and dry.  Psychiatric: He has a normal mood and affect. His behavior is normal.  Nursing note and vitals reviewed.   ED Course  Procedures (including critical care time) DIAGNOSTIC STUDIES: Oxygen Saturation is 100% on RA, normal by my interpretation.    COORDINATION OF CARE: 11:20 PM Discussed treatment plan with patient at beside, the patient agrees with the plan and has no further questions at this time.   Labs Review Labs Reviewed - No data to display  Imaging Review No results found.   EKG Interpretation None  MDM   Final diagnoses:  Pain, dental   Patient emergency department with dental pain. On exam I do not see an obvious cavity, no obvious signs of infection. Tooth is tender to palpation. Question early abscess. Will start on amoxicillin, ibuprofen for pain, we'll refer to a dentist which he is requesting. Tooth is not loose despite the nursing note. No injury.  Filed Vitals:   10/01/14 2218 10/01/14 2325   BP: 144/75 130/73  Pulse: 62 51  Temp: 98.2 F (36.8 C) 98 F (36.7 C)  TempSrc:  Oral  Resp:  16  Height:  (1.854 m)   Weight: 175 lb (79.379 kg)   SpO2: 100% 100%    I personally performed the services described in this documentation, which was scribed in my presence. The recorded information has been reviewed and is accurate.    Jaynie Crumble, PA-C 10/02/14 1610  Arby Barrette, MD 10/03/14 [phone]

## 2014-10-01 NOTE — ED Notes (Signed)
Toothache since yesterday no temp no faciasl swelling

## 2014-10-01 NOTE — ED Notes (Signed)
Patient states his tooth is hurting and points to his right bottom central incisor, appears yellow and slightly loose.  Does not have a dentist, states he comes here because we have helped him before.  Also would like to have his carpel tunnel reevaluated in his RUE, was initially seen in 2013 here and states it is bothering him while working and lifting weights but denies pain at that location currently.

## 2014-12-02 ENCOUNTER — Emergency Department (HOSPITAL_COMMUNITY)
Admission: EM | Admit: 2014-12-02 | Discharge: 2014-12-02 | Disposition: A | Discharge status: Home / Self Care | Payer: Self-pay | Attending: Emergency Medicine | Admitting: Emergency Medicine

## 2014-12-02 ENCOUNTER — Encounter (HOSPITAL_COMMUNITY): Payer: Self-pay | Admitting: Emergency Medicine

## 2014-12-02 DIAGNOSIS — Z792 Long term (current) use of antibiotics: Secondary | ICD-10-CM | POA: Insufficient documentation

## 2014-12-02 DIAGNOSIS — G5601 Carpal tunnel syndrome, right upper limb: Secondary | ICD-10-CM | POA: Insufficient documentation

## 2014-12-02 DIAGNOSIS — Z791 Long term (current) use of non-steroidal anti-inflammatories (NSAID): Secondary | ICD-10-CM | POA: Insufficient documentation

## 2014-12-02 DIAGNOSIS — Z8679 Personal history of other diseases of the circulatory system: Secondary | ICD-10-CM | POA: Insufficient documentation

## 2014-12-02 DIAGNOSIS — Z720 Tobacco use: Secondary | ICD-10-CM | POA: Insufficient documentation

## 2014-12-02 HISTORY — DX: Carpal tunnel syndrome, right upper limb: G56.01

## 2014-12-02 MED ORDER — NAPROXEN 500 MG PO TABS: 500.0000 mg | ORAL_TABLET | Freq: Two times a day (BID) | ORAL | Status: DC

## 2014-12-02 NOTE — ED Provider Notes (Signed)
CSN: [id number]     Arrival date & time 12/02/14  0017 History   First MD Initiated Contact with Patient 12/02/14 0027     No chief complaint on file.  Matthew Lester is a 34 y.o. right hand dominant male with a history of right carpal tunnel who presents to the ED complaining of continued right carpal tunnel pain. He reports he has had pain ongoing for over a year and it has not gotten better. He has not followed up with primary care or with surgery. He reports numbness and tingling in his digits #2.3.&5.  He reports he has a wrist splint at home but she feels like it does not help. He is complaining of 8 out of 10 pain currently. The patient is taking nothing for treatment today. The patient denies recent injury to his wrist or changes to his pain.  (Consider location/radiation/quality/duration/timing/severity/associated sxs/prior Treatment) HPI  Past Medical History  Diagnosis Date  . Migraines   . Carpal tunnel syndrome, right    History reviewed. No pertinent past surgical history. No family history on file. History  Substance Use Topics  . Smoking status: Current Every Day Smoker  . Smokeless tobacco: Never Used  . Alcohol Use: No    Review of Systems  Constitutional: Negative for fever.  Musculoskeletal: Positive for arthralgias. Negative for joint swelling.       Right wrist pain  Skin: Negative for color change, rash and wound.  Neurological: Positive for numbness.      Allergies  Review of patient's allergies indicates no known allergies.  Home Medications   Prior to Admission medications   Medication Sig Start Date End Date Taking? Authorizing Provider  amoxicillin (AMOXIL) 500 MG capsule Take 1 capsule (500 mg total) by mouth 3 (three) times daily. 10/01/14   Tatyana Kirichenko, PA-C  ibuprofen (ADVIL,MOTRIN) 600 MG tablet Take 1 tablet (600 mg total) by mouth every 6 (six) hours as needed. 10/01/14   Tatyana Kirichenko, PA-C  meloxicam (MOBIC) 15 MG tablet Take 1  tablet (15 mg total) by mouth daily. 02/07/14   Courtney Forcucci, PA-C  naproxen (NAPROSYN) 500 MG tablet Take 1 tablet (500 mg total) by mouth 2 (two) times daily with a meal. 12/02/14   [name], PA-C  oxyCODONE-acetaminophen (PERCOCET/ROXICET) 5-325 MG per tablet Take 1 tablet by mouth every 6 (six) hours as needed for moderate pain or severe pain. 02/07/14   Courtney Forcucci, PA-C   BP 138/86 mmHg  Pulse 54  Temp(Src) 98.1 F (36.7 C)  Resp 16  Ht 6' 1.5" (1.867 m)  Wt 185 lb (83.915 kg)  BMI 24.07 kg/m2  SpO2 98% Physical Exam  Constitutional: He is oriented to person, place, and time. He appears well-developed and well-nourished. No distress.   Nontoxic appearing.  HENT:  Head: Normocephalic and atraumatic.  Eyes: Right eye exhibits no discharge. Left eye exhibits no discharge.  Pulmonary/Chest: Effort normal. No respiratory distress.  Musculoskeletal:   No right wrist edema or deformity noted. Positive Tinel's sign. Positive Phalen's sign to right wrist.   Neurological: He is alert and oriented to person, place, and time. Coordination normal.   Sensation is intact to his right distal digits. Patient is able to make a fist. Patient has good range of motion of his right hand and wrist.  Skin: No rash noted. He is not diaphoretic.  Psychiatric: He has a normal mood and affect. His behavior is normal.  Nursing note and vitals reviewed.   ED Course  Procedures (including critical care time) Labs Review Labs Reviewed - No data to display  Imaging Review No results found.   EKG Interpretation None      Filed Vitals:   12/02/14 0018  BP: 138/86  Pulse: 54  Temp: 98.1 F (36.7 C)  Resp: 16  Height: 6' 1.5" (1.867 m)  Weight: 185 lb (83.915 kg)  SpO2: 98%     MDM   Meds given in ED:  Medications - No data to display  Discharge Medication List as of 12/02/2014 12:52 AM    START taking these medications   Details  naproxen (NAPROSYN) 500 MG tablet Take 1  tablet (500 mg total) by mouth 2 (two) times daily with a meal., Starting 12/02/2014, Until Discontinued, Print        Final diagnoses:  Carpal tunnel syndrome of right wrist    this is a 34 year old male who presents the emergency department complaining of ongoing right wrist pain the Scarpa panel pain. The patient was diagnosed carpal tunnel over a year ago. He reports his pain is continued. The patient has not seek any follow-up or other specialist treatment for his symptoms. On exam the patient is afebrile and nontoxic appearing. There is no wrist deformity or edema noted. There is a positive Tinel's and Phalen sign on his right wrist. He reports he has a wrist splint at home but does not like using it because it makes him hurt worse.  I advised him to try using it at night. I advised him to take naproxen for his pain. I encouraged the patient to follow-up with hand surgery and with his primary care provider as the patient likely needs further treatment and likely surgery for his carpal tunnel.   Provided patient with follow-up information with Dr. Orlan Leavens. I advised the patient to follow-up with their primary care provider this week. I advised the patient to return to the emergency department with new or worsening symptoms or new concerns. The patient verbalized understanding and agreement with plan.      [name], PA-C 12/02/14 1610  Tomasita Crumble, MD 12/02/14 [phone]

## 2014-12-02 NOTE — ED Notes (Signed)
Pt verbalizes understanding of d/c instructions and denies any further need at this time. 

## 2014-12-02 NOTE — Discharge Instructions (Signed)
Carpal Tunnel Syndrome °The carpal tunnel is a narrow area located on the palm side of your wrist. The tunnel is formed by the wrist bones and ligaments. Nerves, blood vessels, and tendons pass through the carpal tunnel. Repeated wrist motion or certain diseases may cause swelling within the tunnel. This swelling pinches the main nerve in the wrist (median nerve) and causes the painful hand and arm condition called carpal tunnel syndrome. °CAUSES  °· Repeated wrist motions. °· Wrist injuries. °· Certain diseases like arthritis, diabetes, alcoholism, hyperthyroidism, and kidney failure. °· Obesity. °· Pregnancy. °SYMPTOMS  °· A "pins and needles" feeling in your fingers or hand, especially in your thumb, index and middle fingers. °· Tingling or numbness in your fingers or hand. °· An aching feeling in your entire arm, especially when your wrist and elbow are bent for long periods of time. °· Wrist pain that goes up your arm to your shoulder. °· Pain that goes down into your palm or fingers. °· A weak feeling in your hands. °DIAGNOSIS  °Your health care provider will take your history and perform a physical exam. An electromyography test may be needed. This test measures electrical signals sent out by your nerves into the muscles. The electrical signals are usually slowed by carpal tunnel syndrome. You may also need X-rays. °TREATMENT  °Carpal tunnel syndrome may clear up by itself. Your health care provider may recommend a wrist splint or medicine such as a nonsteroidal anti-inflammatory medicine. Cortisone injections may help. Sometimes, surgery may be needed to free the pinched nerve.  °HOME CARE INSTRUCTIONS  °· Take all medicine as directed by your health care provider. Only take over-the-counter or prescription medicines for pain, discomfort, or fever as directed by your health care provider. °· If you were given a splint to keep your wrist from bending, wear it as directed. It is important to wear the splint at  night. Wear the splint for as long as you have pain or numbness in your hand, arm, or wrist. This may take 1 to 2 months. °· Rest your wrist from any activity that may be causing your pain. If your symptoms are work-related, you may need to talk to your employer about changing to a job that does not require using your wrist. °· Put ice on your wrist after long periods of wrist activity. °· Put ice in a plastic bag. °· Place a towel between your skin and the bag. °· Leave the ice on for 15-20 minutes, 03-04 times a day. °· Keep all follow-up visits as directed by your health care provider. This includes any orthopedic referrals, physical therapy, and rehabilitation. Any delay in getting necessary care could result in a delay or failure of your condition to heal. °SEEK IMMEDIATE MEDICAL CARE IF:  °· You have new, unexplained symptoms. °· Your symptoms get worse and are not helped or controlled with medicines. °MAKE SURE YOU:  °· Understand these instructions. °· Will watch your condition. °· Will get help right away if you are not doing well or get worse. °Document Released: 06/03/2000 Document Revised: 10/21/2013 Document Reviewed: 04/22/2011 °[employer]® Patient Information ©2015 [employer]. This information is not intended to replace advice given to you by your health care provider. Make sure you discuss any questions you have with your health care provider. °Wrist Splint °A wrist splint is a brace that holds your wrist in a fixed position. It can be used to stabilize your wrist so that broken bones and sprains can heal faster,   with less pain. It can also help to relieve pressure on the nerve that runs down the middle of your arm (median nerve). °Splints are available in drugstores without a prescription. They are also available by prescription from orthopedic and medical supply stores. Custom splints made from lightweight materials can be made by physical or occupational therapist. °HOME CARE INSTRUCTIONS °· Wear  your splint as instructed by your caregiver. It may be worn while you sleep. °· Your caregiver may instruct you how to perform certain exercises at home. These exercises help maintain muscle strength in your hand and wrist. They also help to maintain motion in your fingers. °SEEK MEDICAL CARE IF: °· You start to lose feeling in your hand or fingers. °· Your skin or fingernails turn blue or gray, or they feel cold. °MAKE SURE YOU:  °· Understand these instructions. °· Will watch your condition. °· Will get help right away if you are not doing well or get worse. °Document Released: 05/19/2006 Document Revised: 08/29/2011 Document Reviewed: 09/17/2013 °Exit[employer]® Patient Information ©2015 [employer]. This information is not intended to replace advice given to you by your health care provider. Make sure you discuss any questions you have with your health care provider. ° °

## 2014-12-02 NOTE — ED Notes (Signed)
Pt. reports right carpal tunnel pain onset last month .

## 2016-07-27 ENCOUNTER — Encounter (HOSPITAL_COMMUNITY): Payer: Self-pay | Admitting: Emergency Medicine

## 2016-07-27 ENCOUNTER — Emergency Department (HOSPITAL_COMMUNITY)
Admission: EM | Admit: 2016-07-27 | Discharge: 2016-07-27 | Disposition: A | Discharge status: Home / Self Care | Payer: Self-pay | Attending: Emergency Medicine | Admitting: Emergency Medicine

## 2016-07-27 DIAGNOSIS — F17200 Nicotine dependence, unspecified, uncomplicated: Secondary | ICD-10-CM | POA: Insufficient documentation

## 2016-07-27 DIAGNOSIS — J029 Acute pharyngitis, unspecified: Secondary | ICD-10-CM | POA: Insufficient documentation

## 2016-07-27 LAB — RAPID STREP SCREEN (MED CTR MEBANE ONLY): Streptococcus, Group A Screen (Direct): NEGATIVE

## 2016-07-27 MED ORDER — HYDROCODONE-ACETAMINOPHEN 7.5-325 MG/15ML PO SOLN: 15.0000 mL | Freq: Three times a day (TID) | ORAL | 0 refills | Status: DC | PRN

## 2016-07-27 MED ORDER — DEXAMETHASONE SODIUM PHOSPHATE 10 MG/ML IJ SOLN
10.0000 mg | Freq: Once | INTRAMUSCULAR | Status: AC
  Administered 2016-07-27: 10 mg via INTRAMUSCULAR
  Filled 2016-07-27: qty 1

## 2016-07-27 MED ORDER — ACETAMINOPHEN 500 MG PO TABS
1000.0000 mg | ORAL_TABLET | Freq: Once | ORAL | Status: AC
  Administered 2016-07-27: 1000 mg via ORAL
  Filled 2016-07-27: qty 2

## 2016-07-27 NOTE — ED Triage Notes (Signed)
C/o sore throat since last night.

## 2016-07-27 NOTE — Discharge Instructions (Signed)
Take your medication as prescribed as needed for pain relief. I also recommend taking Tylenol and ibuprofen as prescribed over-the-counter, alternating between doses every 3-4 hours. Continue drinking fluids at home to remain hydrated. Follow-up with the primary care provider's office listed below in the next 3-4 days if your symptoms have not improved. Return to the emergency department if symptoms worsen or new onset fever, facial/neck swelling, unable to open her jaw completely, unable to swallow resulting in drooling, difficulty breathing, vomiting, unable to keep fluids down.

## 2016-07-27 NOTE — ED Provider Notes (Signed)
[facility]-EMERGENCY DEPT Provider Note   CSN: [id number] Arrival date & time: 07/27/16  2116  By signing my name below, I, Linna DarnerRussell Turner, attest that this documentation has been prepared under the direction and in the presence of Melburn HakeNicole Terrilynn Postell, [place]. Electronically Signed: Linna Darnerussell Turner, Scribe. 07/27/2016. 11:00 PM.  History   Chief Complaint Chief Complaint  Patient presents with  . Sore Throat    The history is provided by the patient. No language interpreter was used.     HPI Comments: Matthew Lester is a 36 y.o. male who presents to the Emergency Department complaining of a constant, gradually worsening sore throat beginning last night. He endorses exacerbation of his sore throat with swallowing but notes he is able to tolerate food and fluids. No medications or treatments tried PTA. No known sick contacts. He denies fever, rhinorrhea, nasal congestion, cough, facial/neck swelling, drooling, trouble swallowing, vomiting or any other associated symptoms.  Past Medical History:  Diagnosis Date  . Carpal tunnel syndrome, right   . Migraines     There are no active problems to display for this patient.   History reviewed. No pertinent surgical history.     Home Medications    Prior to Admission medications   Medication Sig Start Date End Date Taking? Authorizing Provider  amoxicillin (AMOXIL) 500 MG capsule Take 1 capsule (500 mg total) by mouth 3 (three) times daily. 10/01/14   Tatyana Kirichenko, PA-C  HYDROcodone-acetaminophen (HYCET) 7.5-325 mg/15 ml solution Take 15 mLs by mouth every 8 (eight) hours as needed for moderate pain. 07/27/16   Barrett HenleNicole Elizabeth Rustyn Conery, PA-C  ibuprofen (ADVIL,MOTRIN) 600 MG tablet Take 1 tablet (600 mg total) by mouth every 6 (six) hours as needed. 10/01/14   Tatyana Kirichenko, PA-C  meloxicam (MOBIC) 15 MG tablet Take 1 tablet (15 mg total) by mouth daily. 02/07/14   Courtney Forcucci, PA-C  naproxen (NAPROSYN) 500 MG tablet Take 1 tablet (500 mg  total) by mouth 2 (two) times daily with a meal. 12/02/14   Everlene FarrierWilliam Dansie, PA-C  oxyCODONE-acetaminophen (PERCOCET/ROXICET) 5-325 MG per tablet Take 1 tablet by mouth every 6 (six) hours as needed for moderate pain or severe pain. 02/07/14   Terri Piedraourtney Forcucci, PA-C    Family History No family history on file.  Social History Social History  Substance Use Topics  . Smoking status: Current Every Day Smoker  . Smokeless tobacco: Never Used  . Alcohol use No     Allergies   Patient has no known allergies.   Review of Systems Review of Systems  Constitutional: Negative for fever.  HENT: Positive for sore throat. Negative for congestion, drooling, facial swelling, rhinorrhea and trouble swallowing.   Hematological: Negative for adenopathy.     Physical Exam Updated Vital Signs BP 125/75 (BP Location: Right Arm)   Pulse 74   Temp 97.8 F (36.6 C) (Oral)   Resp 16   SpO2 100%   Physical Exam  Constitutional: He is oriented to person, place, and time. He appears well-developed and well-nourished.  HENT:  Head: Normocephalic and atraumatic.  Right Ear: Tympanic membrane normal.  Left Ear: Tympanic membrane normal.  Nose: Nose normal. Right sinus exhibits no maxillary sinus tenderness and no frontal sinus tenderness. Left sinus exhibits no maxillary sinus tenderness and no frontal sinus tenderness.  Mouth/Throat: Uvula is midline and mucous membranes are normal. No oral lesions. No trismus in the jaw. No uvula swelling. Oropharyngeal exudate and posterior oropharyngeal erythema present. No posterior oropharyngeal edema or tonsillar abscesses.  Tonsils are 1+ on the right. Tonsils are 1+ on the left. Tonsillar exudate.  No facial or neck swelling. Floor of mouth is soft. Patient tolerating secretions, no drooling, no muffled voice.  Eyes: Conjunctivae and EOM are normal. Right eye exhibits no discharge. Left eye exhibits no discharge. No scleral icterus.  Neck: Normal range of  motion. Neck supple.  Cardiovascular: Normal rate, regular rhythm, normal heart sounds and intact distal pulses.   Pulmonary/Chest: Effort normal and breath sounds normal. No respiratory distress. He has no wheezes. He has no rales. He exhibits no tenderness.  Abdominal: Soft. He exhibits no distension.  Musculoskeletal: He exhibits no edema.  Lymphadenopathy:    He has cervical adenopathy (bilateral submandibular).  Neurological: He is alert and oriented to person, place, and time.  Skin: Skin is warm and dry.  Nursing note and vitals reviewed.    ED Treatments / Results  Labs (all labs ordered are listed, but only abnormal results are displayed) Labs Reviewed  RAPID STREP SCREEN (NOT AT [facility])  CULTURE, GROUP A STREP [facility])    EKG  EKG Interpretation None       Radiology No results found.  Procedures Procedures (including critical care time)  DIAGNOSTIC STUDIES: Oxygen Saturation is 100% on RA, normal by my interpretation.    COORDINATION OF CARE: 11:05 PM Discussed treatment plan with pt at bedside and pt agreed to plan.  Medications Ordered in ED Medications  dexamethasone (DECADRON) injection 10 mg (10 mg Intramuscular Given 07/27/16 2331)  acetaminophen (TYLENOL) tablet 1,000 mg (1,000 mg Oral Given 07/27/16 2330)     Initial Impression / Assessment and Plan / ED Course  I have reviewed the triage vital signs and the nursing notes.  Pertinent labs & imaging results that were available during my care of the patient were reviewed by me and considered in my medical decision making (see chart for details).     Pt afebrile with tonsillar exudate, cervical lymphadenopathy, & dysphagia; Negative strep. Suspect sxs are likely due to viral pharyngitis. Treated in the Ed with steroids, NSAIDs.  Pt appears well hydrated, discussed importance of continued water hydration. Presentation non concerning for PTA or infxn spread to soft tissue. No trismus or uvula deviation.  Specific return precautions discussed. Pt able to drink water in ED without difficulty with intact air way. Recommended PCP follow up. Discussed return precautions.   Final Clinical Impressions(s) / ED Diagnoses   Final diagnoses:  Pharyngitis, unspecified etiology    New Prescriptions New Prescriptions   HYDROCODONE-ACETAMINOPHEN (HYCET) 7.5-325 MG/15 ML SOLUTION    Take 15 mLs by mouth every 8 (eight) hours as needed for moderate pain.   I personally performed the services described in this documentation, which was scribed in my presence. The recorded information has been reviewed and is accurate.    Satira Sark [place], [place] 07/27/16 2335    Canary Brim Tegeler, MD 07/28/16 [phone]

## 2016-07-30 LAB — CULTURE, GROUP A STREP (THRC)

## 2016-10-19 ENCOUNTER — Emergency Department (HOSPITAL_COMMUNITY)
Admission: EM | Admit: 2016-10-19 | Discharge: 2016-10-20 | Disposition: A | Discharge status: Home / Self Care | Payer: Self-pay | Attending: Emergency Medicine | Admitting: Emergency Medicine

## 2016-10-19 ENCOUNTER — Encounter (HOSPITAL_COMMUNITY): Payer: Self-pay | Admitting: Emergency Medicine

## 2016-10-19 DIAGNOSIS — Z79899 Other long term (current) drug therapy: Secondary | ICD-10-CM | POA: Insufficient documentation

## 2016-10-19 DIAGNOSIS — J069 Acute upper respiratory infection, unspecified: Secondary | ICD-10-CM | POA: Insufficient documentation

## 2016-10-19 DIAGNOSIS — J329 Chronic sinusitis, unspecified: Secondary | ICD-10-CM | POA: Insufficient documentation

## 2016-10-19 DIAGNOSIS — J3489 Other specified disorders of nose and nasal sinuses: Secondary | ICD-10-CM

## 2016-10-19 DIAGNOSIS — F17200 Nicotine dependence, unspecified, uncomplicated: Secondary | ICD-10-CM | POA: Insufficient documentation

## 2016-10-19 NOTE — ED Triage Notes (Signed)
Patient with URI for the last few weeks.  Patient states that he is having pain in head and face when he bends down.  Patient states that he has been using OTC meds with no relief.

## 2016-10-20 MED ORDER — AZITHROMYCIN 250 MG PO TABS: ORAL_TABLET | ORAL | 0 refills | Status: DC

## 2016-10-20 MED ORDER — LORATADINE 10 MG PO TABS
10.0000 mg | ORAL_TABLET | Freq: Once | ORAL | Status: AC
  Administered 2016-10-20: 10 mg via ORAL
  Filled 2016-10-20: qty 1

## 2016-10-20 MED ORDER — LORATADINE 10 MG PO TABS: 10.0000 mg | ORAL_TABLET | Freq: Every day | ORAL | 0 refills | Status: DC

## 2016-10-20 MED ORDER — FLUTICASONE PROPIONATE 50 MCG/ACT NA SUSP: 1.0000 | Freq: Every day | NASAL | 0 refills | Status: DC

## 2016-10-20 NOTE — ED Provider Notes (Signed)
[facility]-EMERGENCY DEPT Provider Note   CSN: [id number] Arrival date & time: 10/19/16  2119     History   Chief Complaint Chief Complaint  Patient presents with  . Sinusitis    HPI Matthew Lester is a 36 y.o. male who presents to the ED with sinus pressure and headache. Patient reports that he has had congestion, sinus pressure with headache that started 4 days ago and has gotten worse. He denies sick contacts.   The history is provided by the patient. No language interpreter was used.  Sinusitis   This is a new problem. The current episode started more than 2 days ago. The problem has been gradually worsening. There has been no fever. The pain is moderate. The pain has been constant since onset. Associated symptoms include chills, congestion, ear pain, sinus pressure and sore throat (scratchy). Pertinent negatives include no cough. He has tried other medications for the symptoms. The treatment provided no relief.    Past Medical History:  Diagnosis Date  . Carpal tunnel syndrome, right   . Migraines     There are no active problems to display for this patient.   History reviewed. No pertinent surgical history.     Home Medications    Prior to Admission medications   Medication Sig Start Date End Date Taking? Authorizing Provider  amoxicillin (AMOXIL) 500 MG capsule Take 1 capsule (500 mg total) by mouth 3 (three) times daily. 10/01/14   Tatyana Kirichenko, PA-C  azithromycin (ZITHROMAX Z-PAK) 250 MG tablet Take the first 2 tablets together and then one tablet PO daily 10/20/16   [name], NP  fluticasone (FLONASE) 50 MCG/ACT nasal spray Place 1 spray into both nostrils daily. 10/20/16   Vani Gunner Orlene Och, NP  HYDROcodone-acetaminophen (HYCET) 7.5-325 mg/15 ml solution Take 15 mLs by mouth every 8 (eight) hours as needed for moderate pain. 07/27/16   [name], PA-C  ibuprofen (ADVIL,MOTRIN) 600 MG tablet Take 1 tablet (600 mg total) by mouth every 6 (six) hours as  needed. 10/01/14   Tatyana Kirichenko, PA-C  loratadine (CLARITIN) 10 MG tablet Take 1 tablet (10 mg total) by mouth daily. 10/20/16   Vivianna Piccini Orlene Och, NP  meloxicam (MOBIC) 15 MG tablet Take 1 tablet (15 mg total) by mouth daily. 02/07/14   Courtney Forcucci, PA-C  naproxen (NAPROSYN) 500 MG tablet Take 1 tablet (500 mg total) by mouth 2 (two) times daily with a meal. 12/02/14   [name], PA-C  oxyCODONE-acetaminophen (PERCOCET/ROXICET) 5-325 MG per tablet Take 1 tablet by mouth every 6 (six) hours as needed for moderate pain or severe pain. 02/07/14   [name], PA-C    Family History No family history on file.  Social History Social History  Substance Use Topics  . Smoking status: Current Every Day Smoker  . Smokeless tobacco: Never Used  . Alcohol use No     Allergies   Patient has no known allergies.   Review of Systems Review of Systems  Constitutional: Positive for chills. Negative for fever.  HENT: Positive for congestion, ear pain, sinus pressure and sore throat (scratchy). Negative for facial swelling and trouble swallowing.   Eyes: Positive for redness and itching. Negative for visual disturbance.  Respiratory: Negative for cough.   Cardiovascular: Negative for chest pain.  Gastrointestinal: Negative for abdominal pain, nausea and vomiting.  Genitourinary: Negative for dysuria, frequency and urgency.  Musculoskeletal: Negative for back pain and neck stiffness.  Skin: Negative for rash.  Neurological: Positive for  headaches. Negative for dizziness and syncope.  Psychiatric/Behavioral: Negative for confusion.     Physical Exam Updated Vital Signs BP (!) 144/86   Pulse 73   Temp 98.1 F (36.7 C) (Oral)   Resp 17   SpO2 100%   Physical Exam  Constitutional: He is oriented to person, place, and time. He appears well-developed and well-nourished. No distress.  HENT:  Head: Normocephalic and atraumatic.  Right Ear: Tympanic membrane is erythematous.  Left  Ear: Tympanic membrane normal.  Nose: Mucosal edema present.  Mouth/Throat: Uvula is midline and mucous membranes are normal. Posterior oropharyngeal erythema present. No posterior oropharyngeal edema.  Eyes: Conjunctivae and EOM are normal. Pupils are equal, round, and reactive to light.  Neck: Normal range of motion. Neck supple.  Cardiovascular: Normal rate and regular rhythm.   Pulmonary/Chest: Effort normal and breath sounds normal.  Abdominal: Soft. Bowel sounds are normal. There is no tenderness.  Musculoskeletal: Normal range of motion.  Lymphadenopathy:    He has no cervical adenopathy.  Neurological: He is alert and oriented to person, place, and time. No cranial nerve deficit.  Skin: Skin is warm and dry.  Psychiatric: He has a normal mood and affect. His behavior is normal.  Nursing note and vitals reviewed.    ED Treatments / Results  Labs (all labs ordered are listed, but only abnormal results are displayed) Labs Reviewed - No data to display   Radiology No results found.  Procedures Procedures (including critical care time)  Medications Ordered in ED Medications  loratadine (CLARITIN) tablet 10 mg (not administered)     Initial Impression / Assessment and Plan / ED Course  I have reviewed the triage vital signs and the nursing notes.  Final Clinical Impressions(s) / ED Diagnoses  36 y.o. male with nasal and sinus congestion, sore throat, headache and dry cough stable for d/c without fever and does not appear toxic. Discussed with the patient plan of care and all question answered.   Final diagnoses:  Upper respiratory tract infection, unspecified type  Sinus pressure    New Prescriptions New Prescriptions   AZITHROMYCIN (ZITHROMAX Z-PAK) 250 MG TABLET    Take the first 2 tablets together and then one tablet PO daily   FLUTICASONE (FLONASE) 50 MCG/ACT NASAL SPRAY    Place 1 spray into both nostrils daily.   LORATADINE (CLARITIN) 10 MG TABLET    Take 1  tablet (10 mg total) by mouth daily.     [address]Hope CalhounM Andree Heeg, NP 10/20/16 0100    Mancel BaleElliott Wentz, MD 10/20/16 [phone]

## 2017-01-15 ENCOUNTER — Emergency Department (HOSPITAL_COMMUNITY)
Admission: EM | Admit: 2017-01-15 | Discharge: 2017-01-15 | Disposition: A | Discharge status: Home / Self Care | Payer: Self-pay | Attending: Emergency Medicine | Admitting: Emergency Medicine

## 2017-01-15 ENCOUNTER — Emergency Department (HOSPITAL_COMMUNITY): Payer: Self-pay

## 2017-01-15 ENCOUNTER — Encounter (HOSPITAL_COMMUNITY): Payer: Self-pay | Admitting: Emergency Medicine

## 2017-01-15 DIAGNOSIS — M25512 Pain in left shoulder: Secondary | ICD-10-CM | POA: Insufficient documentation

## 2017-01-15 DIAGNOSIS — S01511A Laceration without foreign body of lip, initial encounter: Secondary | ICD-10-CM

## 2017-01-15 DIAGNOSIS — Y998 Other external cause status: Secondary | ICD-10-CM | POA: Insufficient documentation

## 2017-01-15 DIAGNOSIS — F17200 Nicotine dependence, unspecified, uncomplicated: Secondary | ICD-10-CM | POA: Insufficient documentation

## 2017-01-15 DIAGNOSIS — Y9389 Activity, other specified: Secondary | ICD-10-CM | POA: Insufficient documentation

## 2017-01-15 DIAGNOSIS — Y929 Unspecified place or not applicable: Secondary | ICD-10-CM | POA: Insufficient documentation

## 2017-01-15 MED ORDER — LIDOCAINE HCL (PF) 1 % IJ SOLN
5.0000 mL | Freq: Once | INTRAMUSCULAR | Status: AC
  Administered 2017-01-15: 5 mL
  Filled 2017-01-15: qty 5

## 2017-01-15 MED ORDER — IBUPROFEN 800 MG PO TABS: 800.0000 mg | ORAL_TABLET | Freq: Three times a day (TID) | ORAL | 0 refills | Status: DC | PRN

## 2017-01-15 NOTE — Discharge Instructions (Signed)
You were seen in the ED today with a lip laceration. We closed the wound with stitches that will dissolve. The wound was old when we closed it so there is an increased change of opening again or developing infection. Return to the ED if you develop any redness, swelling, or drainage from the wound. You may have to follow up with your PCP regarding your shoulder soreness. I included the information for a sports medicine doctor in the area if your PCP needs assistance.

## 2017-01-15 NOTE — ED Notes (Signed)
Patient in Xray
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
## 2017-04-28 ENCOUNTER — Emergency Department (HOSPITAL_BASED_OUTPATIENT_CLINIC_OR_DEPARTMENT_OTHER): Payer: Self-pay

## 2017-04-28 ENCOUNTER — Emergency Department (HOSPITAL_BASED_OUTPATIENT_CLINIC_OR_DEPARTMENT_OTHER)
Admission: EM | Admit: 2017-04-28 | Discharge: 2017-04-28 | Disposition: A | Discharge status: Home / Self Care | Payer: Self-pay | Attending: Emergency Medicine | Admitting: Emergency Medicine

## 2017-04-28 ENCOUNTER — Encounter (HOSPITAL_BASED_OUTPATIENT_CLINIC_OR_DEPARTMENT_OTHER): Payer: Self-pay

## 2017-04-28 ENCOUNTER — Other Ambulatory Visit: Payer: Self-pay

## 2017-04-28 DIAGNOSIS — Z87891 Personal history of nicotine dependence: Secondary | ICD-10-CM | POA: Insufficient documentation

## 2017-04-28 DIAGNOSIS — Y939 Activity, unspecified: Secondary | ICD-10-CM | POA: Insufficient documentation

## 2017-04-28 DIAGNOSIS — M7071 Other bursitis of hip, right hip: Secondary | ICD-10-CM | POA: Insufficient documentation

## 2017-04-28 DIAGNOSIS — M25551 Pain in right hip: Secondary | ICD-10-CM

## 2017-04-28 DIAGNOSIS — Z79899 Other long term (current) drug therapy: Secondary | ICD-10-CM | POA: Insufficient documentation

## 2017-04-28 HISTORY — DX: Fracture of unspecified parts of lumbosacral spine and pelvis, initial encounter for closed fracture: S32.9XXA

## 2017-04-28 MED ORDER — IBUPROFEN 600 MG PO TABS: 600.0000 mg | ORAL_TABLET | Freq: Four times a day (QID) | ORAL | 0 refills | Status: DC | PRN

## 2017-04-28 MED ORDER — KETOROLAC TROMETHAMINE 30 MG/ML IJ SOLN
30.0000 mg | Freq: Once | INTRAMUSCULAR | Status: AC
  Administered 2017-04-28: 30 mg via INTRAMUSCULAR
  Filled 2017-04-28: qty 1

## 2017-04-28 NOTE — ED Notes (Signed)
Patient transported to X-ray 
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
## 2017-04-28 NOTE — Discharge Instructions (Signed)
Ibuprofen 600mg  every 6 hrs, you may combine with tylenol, but no other OTC meds. Use crutches to help rest hip and elevate whenever possible. Alternate ice and heat. If symptoms not improving please follow-up with Dr. Pearletha ForgeHudnall with Sports Medicine. If you develop redness, warmth, or severe pain, fevers or chills, please return to ED for re-evaluation.
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
## 2017-05-08 ENCOUNTER — Encounter (HOSPITAL_COMMUNITY): Payer: Self-pay

## 2017-05-08 ENCOUNTER — Other Ambulatory Visit: Payer: Self-pay

## 2017-05-08 ENCOUNTER — Emergency Department (HOSPITAL_COMMUNITY)
Admission: EM | Admit: 2017-05-08 | Discharge: 2017-05-08 | Disposition: A | Discharge status: Home / Self Care | Payer: Self-pay | Attending: Emergency Medicine | Admitting: Emergency Medicine

## 2017-05-08 DIAGNOSIS — Z79899 Other long term (current) drug therapy: Secondary | ICD-10-CM | POA: Insufficient documentation

## 2017-05-08 DIAGNOSIS — Z87891 Personal history of nicotine dependence: Secondary | ICD-10-CM | POA: Insufficient documentation

## 2017-05-08 DIAGNOSIS — M5441 Lumbago with sciatica, right side: Secondary | ICD-10-CM | POA: Insufficient documentation

## 2017-05-08 DIAGNOSIS — Z791 Long term (current) use of non-steroidal anti-inflammatories (NSAID): Secondary | ICD-10-CM | POA: Insufficient documentation

## 2017-05-08 DIAGNOSIS — M25551 Pain in right hip: Secondary | ICD-10-CM | POA: Insufficient documentation

## 2017-05-08 MED ORDER — MELOXICAM 7.5 MG PO TABS: 7.5000 mg | ORAL_TABLET | Freq: Every day | ORAL | 0 refills | Status: DC

## 2017-05-08 MED ORDER — KETOROLAC TROMETHAMINE 60 MG/2ML IM SOLN
30.0000 mg | Freq: Once | INTRAMUSCULAR | Status: AC
  Administered 2017-05-08: 30 mg via INTRAMUSCULAR
  Filled 2017-05-08: qty 2

## 2017-05-08 MED ORDER — CYCLOBENZAPRINE HCL 10 MG PO TABS: 10.0000 mg | ORAL_TABLET | Freq: Every evening | ORAL | 0 refills | Status: DC | PRN

## 2017-05-08 MED ORDER — PREDNISONE 10 MG (21) PO TBPK: ORAL_TABLET | Freq: Every day | ORAL | 0 refills | Status: DC

## 2017-05-08 NOTE — Discharge Instructions (Addendum)
Please take Tylenol (acetaminophen) to relieve your pain.  You may take tylenol, up to 1,000 mg (two extra strength pills).  Do not take more than 3,000 mg tylenol in a 24 hour period.  Please check all medication labels as many medications such as pain and cold medications may contain tylenol. Please do not drink alcohol while taking this medication.   Been given a prescription for meloxicam.  Please do not take any nonsteroidal anti-inflammatory drugs while taking this medicine.  Nonsteroidal anti-inflammatory drugs include NSAIDs, ibuprofen, Motrin, BC powder, Goody's Powder, Aleve, naproxen and others.  It is okay to take Tylenol while taking this medicine.  The Toradol shot that you got today is a NSAID.  Please wait at least 8 hours before taking any NSAIDs.  As you are not a diabetic I have given you a prescription for prednisone.  This helps calm down inflammation.  If the meloxicam is not significantly controlling your  pain then you may start the prednisone taper.  I have given you a prescription for Flexeril.  The prescription says to take it at bedtime as needed for muscle spasms, however you may take it every 12 hours if needed.  This medication will most likely make you very sleepy, drowsy, and tired.  It is not safe to drive, operate heavy machinery, care of her small child by yourself, or perform any other tasks where you may harm yourself or others if you were to fall asleep.    You may get a lidocaine patch from the drugstore.  This is a numbing patch that may help with your pain.  Please apply this to the painful areas as directed on the instructions.    There is a prescription drug discount coupon called good Rx.  This is an app that you can download on your smart phone or use online.  This can save you a significant amount on your prescriptions.  I have given you follow-up with an orthopedic surgeon.  Please call them and make an appointment for a visit.  They have many more tools  available to used to assess and control/treat your pain.  Please continue using your crutches as needed.  Please make sure that you do gentle stretching and do not lay on the couch, in bed, or sit in a chair all day.  If you do this then it will make your muscles become more tight and will most likely make your pain and symptoms worse.  If you experience any fevers (temperature over 100.4), nausea, vomiting, developed redness over the area unrelated to heat use, shortness of breath, leg swelling, numbness or tingling of your genitals or upper inner legs, or any changes in bowel or bladder function or have any concerns please return to the emergency room for evaluation.

## 2017-05-08 NOTE — ED Triage Notes (Signed)
Patient complains of chronic pelvis pain from fx years ago, seen lat [facility] and taking ibuprofen with no relief, NAD. No new trauma

## 2017-05-08 NOTE — ED Provider Notes (Signed)
[facility] Provider Note   CSN: [id number] Arrival date & time: 05/08/17  0757     History   Chief Complaint Chief Complaint  Patient presents with  . pain to pelvis-chronic    HPI Matthew Lester is a 36 y.o. male with a history of pelvic fracture approximately 18 years ago presents today for continued right leg pain.  He was seen for this on 04/28/17 at [facility] where he had x-rays obtained and was diagnosed with bursitis.  He reports that his pain is not any better at all.  He denies any fevers.  He reports that he has been using crutches and is still in pain.  He reports that his pain initially started in the right side of his lower back, and now feels like it shooting down his right leg  HPI  Past Medical History:  Diagnosis Date  . Carpal tunnel syndrome, right   . Migraines   . Pelvic fracture (HCC)     There are no active problems to display for this patient.   History reviewed. No pertinent surgical history.     Home Medications    Prior to Admission medications   Medication Sig Start Date End Date Taking? Authorizing Provider  amoxicillin (AMOXIL) 500 MG capsule Take 1 capsule (500 mg total) by mouth 3 (three) times daily. 10/01/14   Kirichenko, Lemont Fillersatyana, PA-C  azithromycin (ZITHROMAX Z-PAK) 250 MG tablet Take the first 2 tablets together and then one tablet PO daily 10/20/16   Janne NapoleonNeese, Hope M, NP  cyclobenzaprine (FLEXERIL) 10 MG tablet Take 1 tablet (10 mg total) at bedtime as needed by mouth for muscle spasms. 05/08/17   Cristina GongHammond, Elizabeth W, PA-C  fluticasone [facility](FLONASE) 50 MCG/ACT nasal spray Place 1 spray into both nostrils daily. 10/20/16   Janne NapoleonNeese, Hope M, NP  HYDROcodone-acetaminophen (HYCET) 7.5-325 mg/15 ml solution Take 15 mLs by mouth every 8 (eight) hours as needed for moderate pain. 07/27/16   Barrett HenleNadeau, Nicole Elizabeth, PA-C  ibuprofen (ADVIL,MOTRIN) 600 MG tablet Take 1 tablet (600 mg total) every 6 (six)  hours as needed by mouth. 04/28/17   Dartha LodgeFord, Kelsey N, PA-C  ibuprofen (ADVIL,MOTRIN) 800 MG tablet Take 1 tablet (800 mg total) by mouth every 8 (eight) hours as needed. 01/15/17   Long, Arlyss RepressJoshua G, MD  loratadine (CLARITIN) 10 MG tablet Take 1 tablet (10 mg total) by mouth daily. 10/20/16   Janne NapoleonNeese, Hope M, NP  meloxicam (MOBIC) 7.5 MG tablet Take 1 tablet (7.5 mg total) daily by mouth. 05/08/17   Cristina GongHammond, Elizabeth W, PA-C  naproxen (NAPROSYN) 500 MG tablet Take 1 tablet (500 mg total) by mouth 2 (two) times daily with a meal. 12/02/14   Everlene Farrieransie, William, PA-C  oxyCODONE-acetaminophen (PERCOCET/ROXICET) 5-325 MG per tablet Take 1 tablet by mouth every 6 (six) hours as needed for moderate pain or severe pain. 02/07/14   Forcucci, Courtney, PA-C  predniSONE (STERAPRED UNI-PAK 21 TAB) 10 MG (21) TBPK tablet Take daily by mouth. Take 6 tabs by mouth daily  for 1 day, then 5 tabs for 1 day, then 4 tabs for 1 day, then 3 tabs for 1 day, 2 tabs for 1 day, then 1 tab by mouth daily for 1 day 05/08/17   Cristina GongHammond, Elizabeth W, PA-C    Family History No family history on file.  Social History Social History   Tobacco Use  . Smoking status: Former Games developermoker  . Smokeless tobacco: Never Used  Substance Use  Topics  . Alcohol use: Yes    Comment: occ  . Drug use: No     Allergies   Patient has no known allergies.   Review of Systems Review of Systems  Constitutional: Negative for fatigue and fever.  Musculoskeletal: Positive for arthralgias. Negative for myalgias.  Neurological: Negative for weakness and numbness.     Physical Exam Updated Vital Signs BP 123/88 (BP Location: Right Arm)   Pulse 75   Temp 98.2 F (36.8 C) (Oral)   Resp 16   Ht 6\' 1"  (1.854 m)   Wt 77.1 kg (170 lb)   SpO2 96%   BMI 22.43 kg/m   Physical Exam  Constitutional: He appears well-developed and well-nourished.  HENT:  Head: Normocephalic and atraumatic.  Cardiovascular:  2+ DP/PT pulses bilaterally.  Musculoskeletal:   He has tenderness to palpation along the lateral aspect of his right thigh from hip to knee.  He also has tenderness to palpation along the anterior hip.  He has tenderness to palpation over his right lumbar spine paraspinal muscles.  Palpation near both re-creates and exacerbates his pain.  Range of motion of right hip limited secondary to pain.  He has full range of motion, sensation, and 5/5 strength of his right ankle.  Compartments of the right leg are soft and easily compressible.  Neurological: He is alert.  Sensation intact to bilateral lower extremities.  Skin: Skin is warm and dry. He is not diaphoretic.  There is no obvious erythema, ecchymosis, edema, induration or wounds to his right leg, right-sided pelvis, or lumbar back.  Psychiatric: He has a normal mood and affect. His behavior is normal.  Nursing note and vitals reviewed.    ED Treatments / Results  Labs (all labs ordered are listed, but only abnormal results are displayed) Labs Reviewed - No data to display  EKG  EKG Interpretation None       Radiology No results found.  Procedures Procedures (including critical care time)  Medications Ordered in ED Medications  ketorolac (TORADOL) injection 30 mg (30 mg Intramuscular Given 05/08/17 1148)     Initial Impression / Assessment and Plan / ED Course  I have reviewed the triage vital signs and the nursing notes.  Pertinent labs & imaging results that were available during my care of the patient were reviewed by me and considered in my medical decision making (see chart for details).    Matthew Lester presents today for continued right leg pain.  He reports that his pain all initially started with his right lumbar back.  He was [facility] for [facility] where x-rays were performed.  He does not have any interval trauma history.  X-rays were negative for fractures.  I feel that his pain is most likely secondary to muscle spasm and  right-sided sciatica.  Patient has crutches that he has been using for ambulation secondary to pain.  I counseled patient on sciatica, radiculopathy, and muscle spasms.  We discussed multiple treatment options.  He elected for a shot of Toradol here today.  He is not diabetic, will start him on prednisone taper for 5 days.  We will also give him Mobic for pain control, along with Flexeril For his muscle spasms.  We discussed alternative treatments, including lidocaine patches which he stated he would rather just get at the drugstore, and using a TENS unit.  He has had successful pain relief from TENS unit in the past and wanted to know  if there were any contraindications to using it for this.  Patient advised on general supportive care.  He was instructed to follow-up with orthopedics regarding symptoms if he does not see improvement.  As he is required to walk around a lot at work, he has been given a work note.  He does not have any personal history of cancer or IV drug abuse, no numbness or tingling of upper inner thighs or genitals.  I am not suspicious for cauda equina at this time.  Septic arthritis was briefly considered, however patient is afebrile, not tachycardic, normotensive, is able to bear weight and move the hip, and if he had been infected for over a week I would expect to see signs other than pain.    Final Clinical Impressions(s) / ED Diagnoses   Final diagnoses:  Right hip pain  Acute right-sided low back pain with right-sided sciatica    ED Discharge Orders        Ordered    cyclobenzaprine (FLEXERIL) 10 MG tablet  At bedtime PRN     05/08/17 1141    meloxicam (MOBIC) 7.5 MG tablet  Daily     05/08/17 1141    predniSONE (STERAPRED UNI-PAK 21 TAB) 10 MG (21) TBPK tablet  Daily     05/08/17 1141       Norman Clay 05/08/17 1945    Azalia Bilis, MD 05/08/17 2134

## 2017-12-12 ENCOUNTER — Encounter (HOSPITAL_COMMUNITY): Payer: Self-pay | Admitting: Emergency Medicine

## 2017-12-12 ENCOUNTER — Emergency Department (HOSPITAL_COMMUNITY): Payer: Self-pay

## 2017-12-12 ENCOUNTER — Other Ambulatory Visit: Payer: Self-pay

## 2017-12-12 ENCOUNTER — Emergency Department (HOSPITAL_COMMUNITY)
Admission: EM | Admit: 2017-12-12 | Discharge: 2017-12-12 | Disposition: A | Discharge status: Home / Self Care | Payer: Self-pay | Attending: Emergency Medicine | Admitting: Emergency Medicine

## 2017-12-12 DIAGNOSIS — S76212A Strain of adductor muscle, fascia and tendon of left thigh, initial encounter: Secondary | ICD-10-CM | POA: Insufficient documentation

## 2017-12-12 DIAGNOSIS — Y999 Unspecified external cause status: Secondary | ICD-10-CM | POA: Insufficient documentation

## 2017-12-12 DIAGNOSIS — Y929 Unspecified place or not applicable: Secondary | ICD-10-CM | POA: Insufficient documentation

## 2017-12-12 DIAGNOSIS — Z87891 Personal history of nicotine dependence: Secondary | ICD-10-CM | POA: Insufficient documentation

## 2017-12-12 DIAGNOSIS — Y939 Activity, unspecified: Secondary | ICD-10-CM | POA: Insufficient documentation

## 2017-12-12 DIAGNOSIS — N5082 Scrotal pain: Secondary | ICD-10-CM | POA: Insufficient documentation

## 2017-12-12 DIAGNOSIS — Y33XXXA Other specified events, undetermined intent, initial encounter: Secondary | ICD-10-CM | POA: Insufficient documentation

## 2017-12-12 LAB — URINALYSIS, ROUTINE W REFLEX MICROSCOPIC
BILIRUBIN URINE: NEGATIVE
Glucose, UA: NEGATIVE mg/dL
HGB URINE DIPSTICK: NEGATIVE
Ketones, ur: NEGATIVE mg/dL
Leukocytes, UA: NEGATIVE
Nitrite: NEGATIVE
PROTEIN: NEGATIVE mg/dL
Specific Gravity, Urine: 1.016 (ref 1.005–1.030)
pH: 6 (ref 5.0–8.0)

## 2017-12-12 MED ORDER — METHOCARBAMOL 500 MG PO TABS: 1000.0000 mg | ORAL_TABLET | Freq: Three times a day (TID) | ORAL | 0 refills | Status: DC | PRN

## 2017-12-12 MED ORDER — IBUPROFEN 600 MG PO TABS: 600.0000 mg | ORAL_TABLET | Freq: Four times a day (QID) | ORAL | 0 refills | Status: DC | PRN

## 2017-12-12 MED ORDER — KETOROLAC TROMETHAMINE 60 MG/2ML IM SOLN
60.0000 mg | Freq: Once | INTRAMUSCULAR | Status: AC
  Administered 2017-12-12: 60 mg via INTRAMUSCULAR
  Filled 2017-12-12: qty 2

## 2017-12-12 NOTE — ED Notes (Signed)
No redness or swelling noted at left groin

## 2017-12-12 NOTE — ED Notes (Signed)
Pt reminded that a urine sample is needed 

## 2017-12-12 NOTE — ED Triage Notes (Addendum)
Pt reports upper left groin pain for the past few days, states leg feels swollen, pt unsure if leg is red, stating he has not looked at it. Pt denies any fall or known injury.

## 2017-12-12 NOTE — ED Provider Notes (Signed)
[facility] EMERGENCY DEPARTMENT Provider Note   CSN: [id number] Arrival date & time: 12/12/17  0807     History   Chief Complaint Chief Complaint  Patient presents with  . Groin Pain    HPI Matthew Lester is a 37 y.o. male.  HPI Patient presents with 3 days of left groin pain radiating into the scrotum.  Worse with movement or standing.  No known injuries.  Denies dysuria, hematuria, frequency, or incontinence.  No penile discharge.  No scrotal masses.  No fever or chills.  No numbness or weakness. Patient states he does have some pain to his left lower leg. Past Medical History:  Diagnosis Date  . Carpal tunnel syndrome, right   . Migraines   . Pelvic fracture (HCC)     There are no active problems to display for this patient.   History reviewed. No pertinent surgical history.      Home Medications    Prior to Admission medications   Medication Sig Start Date End Date Taking? Authorizing Provider  cyclobenzaprine (FLEXERIL) 10 MG tablet Take 1 tablet (10 mg total) at bedtime as needed by mouth for muscle spasms. Patient not taking: Reported on 12/12/2017 05/08/17   [name], PA-C  fluticasone [facility]) 50 MCG/ACT nasal spray Place 1 spray into both nostrils daily. Patient not taking: Reported on 12/12/2017 10/20/16   [name], NP  HYDROcodone-acetaminophen (HYCET) 7.5-325 mg/15 ml solution Take 15 mLs by mouth every 8 (eight) hours as needed for moderate pain. Patient not taking: Reported on 12/12/2017 07/27/16   [name], PA-C  ibuprofen (ADVIL,MOTRIN) 600 MG tablet Take 1 tablet (600 mg total) by mouth every 6 (six) hours as needed for moderate pain. 12/12/17   [name], MD  loratadine (CLARITIN) 10 MG tablet Take 1 tablet (10 mg total) by mouth daily. Patient not taking: Reported on 12/12/2017 10/20/16   [name], NP  meloxicam (MOBIC) 7.5 MG tablet Take 1 tablet (7.5 mg total) daily by mouth. Patient not  taking: Reported on 12/12/2017 05/08/17   [name], PA-C  methocarbamol (ROBAXIN) 500 MG tablet Take 2 tablets (1,000 mg total) by mouth every 8 (eight) hours as needed. 12/12/17   [name], MD  naproxen (NAPROSYN) 500 MG tablet Take 1 tablet (500 mg total) by mouth 2 (two) times daily with a meal. Patient not taking: Reported on 12/12/2017 12/02/14   [name], PA-C  oxyCODONE-acetaminophen (PERCOCET/ROXICET) 5-325 MG per tablet Take 1 tablet by mouth every 6 (six) hours as needed for moderate pain or severe pain. Patient not taking: Reported on 12/12/2017 02/07/14   [name], PA-C  predniSONE (STERAPRED UNI-PAK 21 TAB) 10 MG (21) TBPK tablet Take daily by mouth. Take 6 tabs by mouth daily  for 1 day, then 5 tabs for 1 day, then 4 tabs for 1 day, then 3 tabs for 1 day, 2 tabs for 1 day, then 1 tab by mouth daily for 1 day Patient not taking: Reported on 12/12/2017 05/08/17   [name], PA-C    Family History No family history on file.  Social History Social History   Tobacco Use  . Smoking status: Former Games developer  . Smokeless tobacco: Never Used  Substance Use Topics  . Alcohol use: Yes    Comment: occ  . Drug use: No     Allergies   Patient has no known allergies.   Review of Systems Review of Systems  Constitutional: Negative  for chills and fever.  Gastrointestinal: Negative for abdominal pain, nausea and vomiting.  Genitourinary: Negative for discharge, dysuria, flank pain, frequency, genital sores, penile pain, penile swelling, scrotal swelling and testicular pain.  Musculoskeletal: Positive for myalgias. Negative for back pain.  Skin: Negative for rash and wound.  Neurological: Negative for dizziness, weakness, light-headedness, numbness and headaches.  All other systems reviewed and are negative.    Physical Exam Updated Vital Signs BP (!) 141/83 (BP Location: Left Arm)   Pulse 72   Temp 98.7 F (37.1 C) (Oral)   Resp 16    Ht 6\' 1"  (1.854 m)   Wt 77.1 kg (170 lb)   SpO2 97%   BMI 22.43 kg/m   Physical Exam  Constitutional: He is oriented to person, place, and time. He appears well-developed and well-nourished.  HENT:  Head: Normocephalic and atraumatic.  Neck: Normal range of motion. Neck supple.  Cardiovascular: Normal rate.  Pulmonary/Chest: Effort normal.  Abdominal: Soft. Bowel sounds are normal. There is no tenderness. There is no rebound and no guarding.  Genitourinary: Penis normal.  Genitourinary Comments: No discharge from penis.  Patient has tenderness to palpation at the left inguinal canal at the base of the scrotum.  No appreciated hernia.  No definite testicular tenderness to palpation.  No obvious masses, erythema or warmth.  No skin lesions.  No appreciated inguinal lymphadenopathy.  Testicles are normal lie.  Cremasteric reflex intact.  Musculoskeletal: Normal range of motion. He exhibits no edema or tenderness.  No midline thoracic or lumbar tenderness.  Negative straight leg raise bilaterally.  Patient has some mild left ankle tenderness to palpation but no erythema, warmth, deformity or effusions.  Distal pulses intact.  Neurological: He is alert and oriented to person, place, and time.  Sensation to light touch intact.  No saddle anesthesia.  5/5 motor in all extremities.  Skin: Skin is warm and dry. No rash noted. No erythema.  Psychiatric: He has a normal mood and affect. His behavior is normal.  Nursing note and vitals reviewed.    ED Treatments / Results  Labs (all labs ordered are listed, but only abnormal results are displayed) Labs Reviewed  URINALYSIS, ROUTINE W REFLEX MICROSCOPIC    EKG None  Radiology [place] Scrotum  Result Date: 12/12/2017 CLINICAL DATA:  Scrotal pain. EXAM: SCROTAL ULTRASOUND DOPPLER ULTRASOUND OF THE TESTICLES TECHNIQUE: Complete ultrasound examination of the testicles, epididymis, and other scrotal structures was performed. Color and spectral  Doppler ultrasound were also utilized to evaluate blood flow to the testicles. COMPARISON:  No recent. FINDINGS: Right testicle Measurements: 5.3 x 2.8 x 3.4 cm. No mass or microlithiasis visualized. Left testicle Measurements: 4.7 x 2.3 x 3.2 cm. No mass or microlithiasis visualized. Right epididymis:  Normal in size and appearance. Left epididymis:  Normal in size and appearance. Hydrocele:  None visualized. Varicocele:  Bilateral mild moderate varicoceles noted. Pulsed Doppler interrogation of both testes demonstrates normal low resistance arterial and venous waveforms bilaterally. IMPRESSION: 1.  Bilateral mild moderate varicoceles. 2.  Exam otherwise unremarkable.  No evidence of torsion. Electronically Signed   By: Maisie Fushomas  Register   On: 12/12/2017 10:44   [place] Scrotum Doppler  Result Date: 12/12/2017 CLINICAL DATA:  Scrotal pain. EXAM: SCROTAL ULTRASOUND DOPPLER ULTRASOUND OF THE TESTICLES TECHNIQUE: Complete ultrasound examination of the testicles, epididymis, and other scrotal structures was performed. Color and spectral Doppler ultrasound were also utilized to evaluate blood flow to the testicles. COMPARISON:  None. FINDINGS: Right testicle Measurements: 5.3 x  2.8 x 3.4 cm. No mass or microlithiasis visualized. Left testicle Measurements: 4.7 x 2.3 x 3.2 cm. No mass or microlithiasis visualized. Right epididymis:  Normal in size and appearance. Left epididymis:  Normal in size and appearance. Hydrocele:  None visualized. Varicocele:  Bilateral small moderate varicoceles. Pulsed Doppler interrogation of both testes demonstrates normal low resistance arterial and venous waveforms bilaterally. IMPRESSION: Lorcet 9 1.  Bilateral small moderate varicoceles. 2.  Exam otherwise unremarkable.  No evidence of torsion. Electronically Signed   By: Maisie Fus  Register   On: 12/12/2017 10:15    Procedures Procedures (including critical care time)  Medications Ordered in ED Medications  ketorolac (TORADOL)  injection 60 mg (60 mg Intramuscular Given 12/12/17 1207)     Initial Impression / Assessment and Plan / ED Course  I have reviewed the triage vital signs and the nursing notes.  Pertinent labs & imaging results that were available during my care of the patient were reviewed by me and considered in my medical decision making (see chart for details).     Pain appears to be out of proportion to the exam.  Low suspicion for testicular torsion or incarcerated hernia. Scrotal ultrasound is normal.  UA is normal.  Suspect groin strain.  Will treat symptomatically and advise return to work as tolerated.  Return precautions have been given. Final Clinical Impressions(s) / ED Diagnoses   Final diagnoses:  Scrotal pain  Groin strain, left, initial encounter    ED Discharge Orders        Ordered    ibuprofen (ADVIL,MOTRIN) 600 MG tablet  Every 6 hours PRN     12/12/17 1145    methocarbamol (ROBAXIN) 500 MG tablet  Every 8 hours PRN     12/12/17 1145       [name], MD 12/12/17 1211

## 2017-12-12 NOTE — ED Notes (Signed)
Patient transported to Ultrasound 

## 2017-12-12 NOTE — ED Notes (Signed)
Pt aware that a urine sample is needed; urinal at bedside 

## 2017-12-12 NOTE — ED Notes (Signed)
Pt given Mal[place]key sandwich and sprite per Dr. Ranae PalmsYelverton

## 2018-07-08 ENCOUNTER — Encounter (HOSPITAL_COMMUNITY): Payer: Self-pay | Admitting: Emergency Medicine

## 2018-07-08 ENCOUNTER — Emergency Department (HOSPITAL_COMMUNITY)
Admission: EM | Admit: 2018-07-08 | Discharge: 2018-07-08 | Disposition: A | Discharge status: Home / Self Care | Payer: 59 | Attending: Emergency Medicine | Admitting: Emergency Medicine

## 2018-07-08 DIAGNOSIS — R1084 Generalized abdominal pain: Secondary | ICD-10-CM | POA: Diagnosis present

## 2018-07-08 DIAGNOSIS — Z87891 Personal history of nicotine dependence: Secondary | ICD-10-CM | POA: Insufficient documentation

## 2018-07-08 DIAGNOSIS — K859 Acute pancreatitis without necrosis or infection, unspecified: Secondary | ICD-10-CM | POA: Diagnosis not present

## 2018-07-08 LAB — COMPREHENSIVE METABOLIC PANEL
ALBUMIN: 3.3 g/dL — AB (ref 3.5–5.0)
ALT: 76 U/L — ABNORMAL HIGH (ref 0–44)
AST: 110 U/L — ABNORMAL HIGH (ref 15–41)
Alkaline Phosphatase: 73 U/L (ref 38–126)
Anion gap: 10 (ref 5–15)
BUN: 15 mg/dL (ref 6–20)
CO2: 23 mmol/L (ref 22–32)
Calcium: 8.5 mg/dL — ABNORMAL LOW (ref 8.9–10.3)
Chloride: 103 mmol/L (ref 98–111)
Creatinine, Ser: 1.04 mg/dL (ref 0.61–1.24)
GFR calc Af Amer: >60 mL/min (ref >60)
GFR calc non Af Amer: >60 mL/min (ref >60)
Glucose, Bld: 86 mg/dL (ref 70–99)
Potassium: 4.1 mmol/L (ref 3.5–5.1)
Sodium: 136 mmol/L (ref 135–145)
Total Bilirubin: 0.9 mg/dL (ref 0.3–1.2)
Total Protein: 6 g/dL — ABNORMAL LOW (ref 6.5–8.1)

## 2018-07-08 LAB — CBC
HCT: 36.6 % — ABNORMAL LOW (ref 39.0–52.0)
Hemoglobin: 12.8 g/dL — ABNORMAL LOW (ref 13.0–17.0)
MCH: 31.1 pg (ref 26.0–34.0)
MCHC: 35 g/dL (ref 30.0–36.0)
MCV: 88.8 fL (ref 80.0–100.0)
Platelets: 228 10*3/uL (ref 150–400)
RBC: 4.12 MIL/uL — ABNORMAL LOW (ref 4.22–5.81)
RDW: 14.2 % (ref 11.5–15.5)
WBC: 5 10*3/uL (ref 4.0–10.5)
nRBC: 0 % (ref 0.0–0.2)

## 2018-07-08 LAB — LIPASE, BLOOD: Lipase: 60 U/L — ABNORMAL HIGH (ref 11–51)

## 2018-07-08 MED ORDER — M.V.I. ADULT IV INJ
Freq: Once | INTRAVENOUS | Status: AC
  Administered 2018-07-08: 08:00:00 via INTRAVENOUS
  Filled 2018-07-08: qty 10

## 2018-07-08 MED ORDER — ONDANSETRON 4 MG PO TBDP: 4.0000 mg | ORAL_TABLET | Freq: Three times a day (TID) | ORAL | 0 refills | Status: DC | PRN

## 2018-07-08 MED ORDER — SODIUM CHLORIDE 0.9% FLUSH: 3.0000 mL | Freq: Once | INTRAVENOUS | Status: DC

## 2018-07-08 MED ORDER — THIAMINE HCL 100 MG/ML IJ SOLN
100.0000 mg | Freq: Once | INTRAMUSCULAR | Status: AC
  Administered 2018-07-08: 100 mg via INTRAVENOUS
  Filled 2018-07-08: qty 2

## 2018-07-08 MED ORDER — SODIUM CHLORIDE 0.9 % IV BOLUS
1000.0000 mL | Freq: Once | INTRAVENOUS | Status: AC
  Administered 2018-07-08: 1000 mL via INTRAVENOUS

## 2018-07-08 MED ORDER — FOLIC ACID 1 MG PO TABS
1.0000 mg | ORAL_TABLET | Freq: Once | ORAL | Status: AC
  Administered 2018-07-08: 1 mg via ORAL
  Filled 2018-07-08: qty 1

## 2018-07-08 NOTE — ED Provider Notes (Signed)
[facility] EMERGENCY DEPARTMENT Provider Note   CSN: [id number] Arrival date & time: 07/08/18  0451     History   Chief Complaint Chief Complaint  Patient presents with  . cramping  . Abdominal Pain    HPI Matthew Lester is a 38 y.o. male.  The history is provided by the patient. No language interpreter was used.  Abdominal Pain  Pain location:  Generalized Pain quality: aching   Pain radiates to:  Does not radiate Onset quality:  Gradual Timing:  Constant Progression:  Worsening Chronicity:  New Context: alcohol use   Relieved by:  Nothing Worsened by:  Nothing Ineffective treatments:  None tried Associated symptoms: no vomiting   Risk factors: alcohol abuse   Pt complains of cramping in his arms and legs.  Pt reports he has upper abdominal pain.  Pt reports he drinks more than he should  Past Medical History:  Diagnosis Date  . Carpal tunnel syndrome, right   . Migraines   . Pelvic fracture (HCC)     There are no active problems to display for this patient.   History reviewed. No pertinent surgical history.      Home Medications    Prior to Admission medications   Medication Sig Start Date End Date Taking? Authorizing Provider  ondansetron (ZOFRAN ODT) 4 MG disintegrating tablet Take 1 tablet (4 mg total) by mouth every 8 (eight) hours as needed for nausea or vomiting. 07/08/18   Elson Areas, PA-C    Family History No family history on file.  Social History Social History   Tobacco Use  . Smoking status: Former Games developer  . Smokeless tobacco: Never Used  Substance Use Topics  . Alcohol use: Yes    Comment: occ  . Drug use: No     Allergies   Patient has no known allergies.   Review of Systems Review of Systems  Gastrointestinal: Positive for abdominal pain. Negative for vomiting.  All other systems reviewed and are negative.    Physical Exam Updated Vital Signs BP 107/81   Pulse (!) 51   Temp 97.8 F (36.6  C) (Oral)   Resp 18   SpO2 98%   Physical Exam Vitals signs and nursing note reviewed.  Constitutional:      Appearance: He is well-developed.  HENT:     Head: Normocephalic.     Mouth/Throat:     Mouth: Mucous membranes are moist.  Eyes:     Extraocular Movements: Extraocular movements intact.  Neck:     Musculoskeletal: Normal range of motion.  Cardiovascular:     Rate and Rhythm: Normal rate and regular rhythm.  Pulmonary:     Effort: Pulmonary effort is normal.  Abdominal:     General: Abdomen is flat. There is no distension.     Palpations: Abdomen is soft.  Musculoskeletal: Normal range of motion.  Skin:    General: Skin is warm.  Neurological:     General: No focal deficit present.     Mental Status: He is alert and oriented to person, place, and time.  Psychiatric:        Mood and Affect: Mood normal.      ED Treatments / Results  Labs (all labs ordered are listed, but only abnormal results are displayed) Labs Reviewed  LIPASE, BLOOD - Abnormal; Notable for the following components:      Result Value   Lipase 60 (*)    All other components within normal limits  COMPREHENSIVE METABOLIC PANEL - Abnormal; Notable for the following components:   Calcium 8.5 (*)    Total Protein 6.0 (*)    Albumin 3.3 (*)    AST 110 (*)    ALT 76 (*)    All other components within normal limits  CBC - Abnormal; Notable for the following components:   RBC 4.12 (*)    Hemoglobin 12.8 (*)    HCT 36.6 (*)    All other components within normal limits  URINALYSIS, ROUTINE W REFLEX MICROSCOPIC    EKG None  Radiology No results found.  Procedures Procedures (including critical care time)  Medications Ordered in ED Medications  sodium chloride flush (NS) 0.9 % injection 3 mL (0 mLs Intravenous Hold 07/08/18 0733)  sodium chloride 0.9 % bolus 1,000 mL (1,000 mLs Intravenous New Bag/Given 07/08/18 0744)  thiamine (B-1) injection 100 mg (100 mg Intravenous Given 07/08/18  0745)  folic acid (FOLVITE) tablet 1 mg (1 mg Oral Given 07/08/18 0744)  multivitamins adult (INFUVITE ADULT) 10 mL in dextrose 5% lactated ringers 1,000 mL infusion ( Intravenous New Bag/Given 07/08/18 0807)     Initial Impression / Assessment and Plan / ED Course  I have reviewed the triage vital signs and the nursing notes.  Pertinent labs & imaging results that were available during my care of the patient were reviewed by me and considered in my medical decision making (see chart for details).     MDM  Pt given iv fluids,  Multi vitamin.   Pt has elevated lipase.  I discussed pancreatitis with pt.  I advsied follow up with primary care.  clesr liquids x 24 hours.    Final Clinical Impressions(s) / ED Diagnoses   Final diagnoses:  Generalized abdominal pain  Acute pancreatitis, unspecified complication status, unspecified pancreatitis type    ED Discharge Orders         Ordered    ondansetron (ZOFRAN ODT) 4 MG disintegrating tablet  Every 8 hours PRN     07/08/18 1013        An After Visit Summary was printed and given to the patient.    Elson Areas, Cordelia Poche 07/08/18 1053    Tilden Fossa, MD 07/09/18 [phone]

## 2018-07-08 NOTE — ED Notes (Signed)
ED Provider at bedside. 

## 2018-07-08 NOTE — ED Notes (Signed)
Patient verbalizes understanding of discharge instructions. Opportunity for questioning and answers were provided. Armband removed by staff, pt discharged from ED. Pt ambulatory to lobby.  

## 2018-07-08 NOTE — ED Notes (Signed)
Pt unable to void at this time. 

## 2018-07-08 NOTE — Discharge Instructions (Addendum)
Return if any problems. Avoid alcohol  Schedule appointment with a primary care MD for recheck

## 2018-07-08 NOTE — ED Triage Notes (Signed)
Pt reports generalized muscle cramping X1 month.  Started in hands has progressed all over body.  Pt reports abdominal pain as well.

## 2018-07-26 ENCOUNTER — Emergency Department (HOSPITAL_COMMUNITY): Payer: 59

## 2018-07-26 ENCOUNTER — Encounter (HOSPITAL_COMMUNITY): Payer: Self-pay

## 2018-07-26 ENCOUNTER — Emergency Department (HOSPITAL_COMMUNITY)
Admission: EM | Admit: 2018-07-26 | Discharge: 2018-07-26 | Disposition: A | Discharge status: Home / Self Care | Payer: 59 | Attending: Emergency Medicine | Admitting: Emergency Medicine

## 2018-07-26 DIAGNOSIS — Z79899 Other long term (current) drug therapy: Secondary | ICD-10-CM | POA: Insufficient documentation

## 2018-07-26 DIAGNOSIS — J101 Influenza due to other identified influenza virus with other respiratory manifestations: Secondary | ICD-10-CM | POA: Diagnosis not present

## 2018-07-26 DIAGNOSIS — J111 Influenza due to unidentified influenza virus with other respiratory manifestations: Secondary | ICD-10-CM

## 2018-07-26 DIAGNOSIS — R69 Illness, unspecified: Secondary | ICD-10-CM

## 2018-07-26 DIAGNOSIS — R05 Cough: Secondary | ICD-10-CM | POA: Diagnosis present

## 2018-07-26 LAB — GROUP A STREP BY PCR: Group A Strep by PCR: NOT DETECTED

## 2018-07-26 MED ORDER — NAPROXEN 250 MG PO TABS
500.0000 mg | ORAL_TABLET | Freq: Once | ORAL | Status: AC
  Administered 2018-07-26: 500 mg via ORAL
  Filled 2018-07-26: qty 2

## 2018-07-26 MED ORDER — BENZONATATE 100 MG PO CAPS: 100.0000 mg | ORAL_CAPSULE | Freq: Three times a day (TID) | ORAL | 0 refills | Status: DC | PRN

## 2018-07-26 MED ORDER — NAPROXEN 500 MG PO TABS: 500.0000 mg | ORAL_TABLET | Freq: Two times a day (BID) | ORAL | 0 refills | Status: DC

## 2018-07-26 MED ORDER — GUAIFENESIN ER 600 MG PO TB12: 600.0000 mg | ORAL_TABLET | Freq: Two times a day (BID) | ORAL | 0 refills | Status: DC

## 2018-07-26 NOTE — ED Provider Notes (Signed)
[facility] EMERGENCY DEPARTMENT Provider Note   CSN: [id number] Arrival date & time: 07/26/18  1446     History   Chief Complaint Cough, congestion  HPI Matthew Lester is a 38 y.o. male.  HPI Pt presents with diffuse body aches.  It started a few days ago.  He has been coughing.  He has been congested.  Throat is sore.  His back aches.  He feels weak all over.  No vomiting or diarrhea.  No known fevers.   Past Medical History:  Diagnosis Date  . Carpal tunnel syndrome, right   . Migraines   . Pelvic fracture (HCC)     There are no active problems to display for this patient.   History reviewed. No pertinent surgical history.      Home Medications    Prior to Admission medications   Medication Sig Start Date End Date Taking? Authorizing Provider  benzonatate (TESSALON) 100 MG capsule Take 1 capsule (100 mg total) by mouth 3 (three) times daily as needed for cough. 07/26/18   Linwood Dibbles, MD  guaiFENesin (MUCINEX) 600 MG 12 hr tablet Take 1 tablet (600 mg total) by mouth 2 (two) times daily. 07/26/18   Linwood Dibbles, MD  naproxen (NAPROSYN) 500 MG tablet Take 1 tablet (500 mg total) by mouth 2 (two) times daily with a meal. As needed for pain 07/26/18   Linwood Dibbles, MD  ondansetron (ZOFRAN ODT) 4 MG disintegrating tablet Take 1 tablet (4 mg total) by mouth every 8 (eight) hours as needed for nausea or vomiting. 07/08/18   Elson Areas, PA-C    Family History No family history on file.  Social History Social History   Tobacco Use  . Smoking status: Former Games developer  . Smokeless tobacco: Never Used  Substance Use Topics  . Alcohol use: Yes    Comment: occ  . Drug use: No     Allergies   Patient has no known allergies.   Review of Systems Review of Systems   Physical Exam Updated Vital Signs BP 129/77 (BP Location: Right Arm)   Pulse (!) 115   Temp 99.3 F (37.4 C) (Oral)   Resp 18   SpO2 99%   Physical Exam Vitals signs and nursing note  reviewed.  Constitutional:      General: He is not in acute distress.    Appearance: He is well-developed.  HENT:     Head: Normocephalic and atraumatic.     Right Ear: External ear normal.     Left Ear: External ear normal.     Nose: Congestion present.     Mouth/Throat:     Pharynx: Posterior oropharyngeal erythema present. No oropharyngeal exudate.  Eyes:     General: No scleral icterus.       Right eye: No discharge.        Left eye: No discharge.     Conjunctiva/sclera: Conjunctivae normal.  Neck:     Musculoskeletal: Neck supple.     Trachea: No tracheal deviation.  Cardiovascular:     Rate and Rhythm: Normal rate and regular rhythm.  Pulmonary:     Effort: Pulmonary effort is normal. No respiratory distress.     Breath sounds: Normal breath sounds. No stridor. No wheezing or rales.  Abdominal:     General: Bowel sounds are normal. There is no distension.     Palpations: Abdomen is soft.     Tenderness: There is no abdominal tenderness. There is no guarding  or rebound.  Musculoskeletal:        General: No tenderness.  Skin:    General: Skin is warm and dry.     Findings: No rash.  Neurological:     Mental Status: He is alert.     Cranial Nerves: No cranial nerve deficit (no facial droop, extraocular movements intact, no slurred speech).     Sensory: No sensory deficit.     Motor: No abnormal muscle tone or seizure activity.     Coordination: Coordination normal.      ED Treatments / Results  Labs (all labs ordered are listed, but only abnormal results are displayed) Labs Reviewed  GROUP A STREP BY PCR    EKG None  Radiology Dg Chest 2 View  Result Date: 07/26/2018 CLINICAL DATA:  Pt c/o productive cough and fever x 3 days. No hx of heart or lung problems. Pt is a former smoker. EXAM: CHEST - 2 VIEW COMPARISON:  12/02/2010 FINDINGS: Lungs are hyperinflated, clear. Heart size and mediastinal contours are within normal limits. No effusion. Visualized bones  unremarkable. IMPRESSION: No acute cardiopulmonary disease. Chronic pulmonary hyperinflation. Electronically Signed   By: Corlis Leak M.D.   On: 07/26/2018 16:57    Procedures Procedures (including critical care time)  Medications Ordered in ED Medications  naproxen (NAPROSYN) tablet 500 mg (500 mg Oral Given 07/26/18 1636)     Initial Impression / Assessment and Plan / ED Course  I have reviewed the triage vital signs and the nursing notes.  Pertinent labs & imaging results that were available during my care of the patient were reviewed by me and considered in my medical decision making (see chart for details).   Chest x-ray without pneumonia.  Strep test is negative.  Symptoms most likely related to an influenza-like illness.  Discharged home with medications for symptomatic treatment.  Stable for outpatient follow-up.  Warning signs precautions discussed.  Final Clinical Impressions(s) / ED Diagnoses   Final diagnoses:  Influenza-like illness    ED Discharge Orders         Ordered    benzonatate (TESSALON) 100 MG capsule  3 times daily PRN     07/26/18 1736    naproxen (NAPROSYN) 500 MG tablet  2 times daily with meals     07/26/18 1736    guaiFENesin (MUCINEX) 600 MG 12 hr tablet  2 times daily     07/26/18 1736           Linwood Dibbles, MD 07/26/18 [phone]

## 2018-07-26 NOTE — ED Notes (Signed)
Pt verbalized understanding of discharge paperwork, prescriptions and follow-up care 

## 2018-07-26 NOTE — Discharge Instructions (Addendum)
Take the medications as prescribed, rest, drink plenty of fluids, follow-up with your primary care doctor if not improving in the next week

## 2018-07-26 NOTE — ED Triage Notes (Signed)
Patient complains of body aches, chills, cough and congestion x 2-3 days. Patient alert and oriented, NAD

## 2020-03-15 ENCOUNTER — Other Ambulatory Visit: Payer: Self-pay

## 2020-03-15 ENCOUNTER — Encounter (HOSPITAL_COMMUNITY): Payer: Self-pay

## 2020-03-15 ENCOUNTER — Emergency Department (HOSPITAL_COMMUNITY)
Admission: EM | Admit: 2020-03-15 | Discharge: 2020-03-16 | Disposition: A | Discharge status: Home / Self Care | Payer: 59 | Attending: Emergency Medicine | Admitting: Emergency Medicine

## 2020-03-15 DIAGNOSIS — Z87891 Personal history of nicotine dependence: Secondary | ICD-10-CM | POA: Insufficient documentation

## 2020-03-15 DIAGNOSIS — U071 COVID-19: Secondary | ICD-10-CM | POA: Insufficient documentation

## 2020-03-15 DIAGNOSIS — R5383 Other fatigue: Secondary | ICD-10-CM | POA: Diagnosis present

## 2020-03-15 NOTE — ED Triage Notes (Signed)
Pt reports that he just feels sick for the past few days, fatigue, chills and generalized body aches, diarrhea. Unvaccinated

## 2020-03-16 LAB — RESPIRATORY PANEL BY RT PCR (FLU A&B, COVID)
Influenza A by PCR: NEGATIVE
Influenza B by PCR: NEGATIVE
SARS Coronavirus 2 by RT PCR: POSITIVE — AB

## 2020-03-16 NOTE — ED Provider Notes (Signed)
[facility] EMERGENCY DEPARTMENT Provider Note   CSN: [id number] Arrival date & time: 03/15/20  2314     History Chief Complaint  Patient presents with  . Fatigue    Matthew Lester is a 39 y.o. male.  HPI      39 yo male presents with 4 days of chills, body aches, fatigue, cough.  Patient denies dyspnea, abdominal pain, nausea vomiting or diarrhea.  No covid vaccine or known infection or exposure.   Past Medical History:  Diagnosis Date  . Carpal tunnel syndrome, right   . Migraines   . Pelvic fracture (HCC)     There are no problems to display for this patient.   History reviewed. No pertinent surgical history.     No family history on file.  Social History   Tobacco Use  . Smoking status: Former Games developer  . Smokeless tobacco: Never Used  Substance Use Topics  . Alcohol use: Yes    Comment: occ  . Drug use: No    Home Medications Prior to Admission medications   Medication Sig Start Date End Date Taking? Authorizing Provider  benzonatate (TESSALON) 100 MG capsule Take 1 capsule (100 mg total) by mouth 3 (three) times daily as needed for cough. 07/26/18   Linwood Dibbles, MD  guaiFENesin (MUCINEX) 600 MG 12 hr tablet Take 1 tablet (600 mg total) by mouth 2 (two) times daily. 07/26/18   Linwood Dibbles, MD  naproxen (NAPROSYN) 500 MG tablet Take 1 tablet (500 mg total) by mouth 2 (two) times daily with a meal. As needed for pain 07/26/18   Linwood Dibbles, MD  ondansetron (ZOFRAN ODT) 4 MG disintegrating tablet Take 1 tablet (4 mg total) by mouth every 8 (eight) hours as needed for nausea or vomiting. 07/08/18   Elson Areas, PA-C    Allergies    Patient has no known allergies.  Review of Systems   Review of Systems  All other systems reviewed and are negative.   Physical Exam Updated Vital Signs BP (!) 184/80 (BP Location: Left Arm)   Pulse (!) 59   Temp 97.7 F (36.5 C) (Oral)   Resp 17   SpO2 97%   Physical Exam Vitals and nursing note  reviewed.  Constitutional:      Appearance: He is well-developed.  HENT:     Head: Normocephalic and atraumatic.     Right Ear: External ear normal.     Left Ear: External ear normal.     Nose: Nose normal.  Eyes:     Conjunctiva/sclera: Conjunctivae normal.     Pupils: Pupils are equal, round, and reactive to light.  Cardiovascular:     Rate and Rhythm: Normal rate and regular rhythm.     Heart sounds: Normal heart sounds.  Pulmonary:     Effort: Pulmonary effort is normal. No respiratory distress.     Breath sounds: Normal breath sounds. No wheezing.  Chest:     Chest wall: No tenderness.  Abdominal:     General: Bowel sounds are normal. There is no distension.     Palpations: Abdomen is soft. There is no mass.     Tenderness: There is no abdominal tenderness. There is no guarding.  Musculoskeletal:        General: Normal range of motion.     Cervical back: Normal range of motion and neck supple.  Skin:    General: Skin is warm and dry.  Neurological:     Mental Status:  He is alert and oriented to person, place, and time.     Motor: No abnormal muscle tone.     Coordination: Coordination normal.     Deep Tendon Reflexes: Reflexes are normal and symmetric.  Psychiatric:        Behavior: Behavior normal.        Thought Content: Thought content normal.        Judgment: Judgment normal.     ED Results / Procedures / Treatments   Labs (all labs ordered are listed, but only abnormal results are displayed) Labs Reviewed  RESPIRATORY PANEL BY RT PCR (FLU A&B, COVID) - Abnormal; Notable for the following components:      Result Value   SARS Coronavirus 2 by RT PCR POSITIVE (*)    All other components within normal limits    EKG None  Radiology No results found.  Procedures Procedures (including critical care time)  Medications Ordered in ED Medications - No data to display  ED Course  I have reviewed the triage vital signs and the nursing notes.  Pertinent  labs & imaging results that were available during my care of the patient were reviewed by me and considered in my medical decision making (see chart for details).    MDM Rules/Calculators/A&P                          Positive covid test here. No known risk factors Matthew Lester was evaluated in Emergency Department on 03/16/2020 for the symptoms described in the history of present illness. He was evaluated in the context of the global COVID-19 pandemic, which necessitated consideration that the patient might be at risk for infection with the SARS-CoV-2 virus that causes COVID-19. Institutional protocols and algorithms that pertain to the evaluation of patients at risk for COVID-19 are in a state of rapid change based on information released by regulatory bodies including the CDC and federal and state organizations. These policies and algorithms were followed during the patient's care in the ED.  Final Clinical Impression(s) / ED Diagnoses Final diagnoses:  COVID-19    Rx / DC Orders ED Discharge Orders    None       Margarita Grizzle, MD 03/16/20 [phone]

## 2020-03-16 NOTE — ED Notes (Signed)
Discharge instructions discussed with patient, discharged from ED in NAD  

## 2020-03-17 ENCOUNTER — Other Ambulatory Visit: Payer: Self-pay | Admitting: Nurse Practitioner

## 2020-03-17 NOTE — Progress Notes (Signed)
COVID [facility] Contact Note   Qualifiers:  Symptoms:  Symptom Onset:    Attempted to contact patient via telephone to discuss symptoms and evaluate for interest and qualifications for [facility] treatment. Patient qualifies for at-risk group according to the FDA Emergency Use Authorization due to most recent BP readings.   Unable to reach patient due to phone line no longer in service. No MyChart.   Shawna Clamp, DNP, AGNP-c COVID-19 [facility] [phone]

## 2020-03-29 ENCOUNTER — Encounter (HOSPITAL_BASED_OUTPATIENT_CLINIC_OR_DEPARTMENT_OTHER): Payer: Self-pay | Admitting: *Deleted

## 2020-03-29 ENCOUNTER — Emergency Department (HOSPITAL_BASED_OUTPATIENT_CLINIC_OR_DEPARTMENT_OTHER): Payer: 59

## 2020-03-29 ENCOUNTER — Other Ambulatory Visit: Payer: Self-pay

## 2020-03-29 DIAGNOSIS — Z87891 Personal history of nicotine dependence: Secondary | ICD-10-CM | POA: Insufficient documentation

## 2020-03-29 DIAGNOSIS — U071 COVID-19: Secondary | ICD-10-CM | POA: Insufficient documentation

## 2020-03-29 DIAGNOSIS — R059 Cough, unspecified: Secondary | ICD-10-CM | POA: Diagnosis present

## 2020-03-29 NOTE — ED Triage Notes (Signed)
Pt dx with covid on 9/26. States he was symptomatic prior to that. States he still feels bad, cough, body aches

## 2020-03-29 NOTE — ED Triage Notes (Signed)
HR 100, O2 sats 96% on room air; Pt alert

## 2020-03-30 ENCOUNTER — Other Ambulatory Visit: Payer: Self-pay

## 2020-03-30 ENCOUNTER — Emergency Department (HOSPITAL_BASED_OUTPATIENT_CLINIC_OR_DEPARTMENT_OTHER)
Admission: EM | Admit: 2020-03-30 | Discharge: 2020-03-30 | Disposition: A | Discharge status: Home / Self Care | Payer: 59 | Attending: Emergency Medicine | Admitting: Emergency Medicine

## 2020-03-30 DIAGNOSIS — R0789 Other chest pain: Secondary | ICD-10-CM

## 2020-03-30 DIAGNOSIS — U071 COVID-19: Secondary | ICD-10-CM

## 2020-03-30 LAB — BASIC METABOLIC PANEL
Anion gap: 13 (ref 5–15)
BUN: 9 mg/dL (ref 6–20)
CO2: 22 mmol/L (ref 22–32)
Calcium: 8.1 mg/dL — ABNORMAL LOW (ref 8.9–10.3)
Chloride: 104 mmol/L (ref 98–111)
Creatinine, Ser: 0.94 mg/dL (ref 0.61–1.24)
GFR, Estimated: >60 mL/min (ref >60)
Glucose, Bld: 126 mg/dL — ABNORMAL HIGH (ref 70–99)
Potassium: 3.5 mmol/L (ref 3.5–5.1)
Sodium: 139 mmol/L (ref 135–145)

## 2020-03-30 LAB — CBC WITH DIFFERENTIAL/PLATELET
Abs Immature Granulocytes: 0.01 10*3/uL (ref 0.00–0.07)
Basophils Absolute: 0 10*3/uL (ref 0.0–0.1)
Basophils Relative: 0 %
Eosinophils Absolute: 0.1 10*3/uL (ref 0.0–0.5)
Eosinophils Relative: 1 %
HCT: 35.9 % — ABNORMAL LOW (ref 39.0–52.0)
Hemoglobin: 12.5 g/dL — ABNORMAL LOW (ref 13.0–17.0)
Immature Granulocytes: 0 %
Lymphocytes Relative: 36 %
Lymphs Abs: 1.7 10*3/uL (ref 0.7–4.0)
MCH: 29.5 pg (ref 26.0–34.0)
MCHC: 34.8 g/dL (ref 30.0–36.0)
MCV: 84.7 fL (ref 80.0–100.0)
Monocytes Absolute: 0.5 10*3/uL (ref 0.1–1.0)
Monocytes Relative: 10 %
Neutro Abs: 2.6 10*3/uL (ref 1.7–7.7)
Neutrophils Relative %: 53 %
Platelets: 240 10*3/uL (ref 150–400)
RBC: 4.24 MIL/uL (ref 4.22–5.81)
RDW: 12.3 % (ref 11.5–15.5)
WBC: 4.9 10*3/uL (ref 4.0–10.5)
nRBC: 0 % (ref 0.0–0.2)

## 2020-03-30 LAB — TROPONIN I (HIGH SENSITIVITY): Troponin I (High Sensitivity): <2 ng/L (ref <18)

## 2020-03-30 LAB — D-DIMER, QUANTITATIVE: D-Dimer, Quant: 0.31 ug/mL-FEU (ref 0.00–0.50)

## 2020-03-30 MED ORDER — SODIUM CHLORIDE 0.9 % IV BOLUS
500.0000 mL | Freq: Once | INTRAVENOUS | Status: AC
  Administered 2020-03-30: 500 mL via INTRAVENOUS

## 2020-03-30 NOTE — ED Provider Notes (Signed)
[facility] EMERGENCY DEPARTMENT Provider Note   CSN: [id number] Arrival date & time: 03/29/20  2000     History Chief Complaint  Patient presents with  . Generalized Body Aches    Matthew Lester is a 39 y.o. male.  HPI     This is a 39 year old male with history of migraines who presents with myalgias, cough, shortness of breath, chills, and chest pain.  Patient reports that he was diagnosed with COVID-19 on 9/26.  He did not receive monoclonal antibody treatment and is unvaccinated.  He states he has had ongoing chills and body aches.  No documented fevers.  He also reports nonproductive cough, shortness of breath, and sharp chest pains that are intermittent.  He states "I just do not feel well."  Has not noted any lower extremity swelling.  He has not taken anything for his symptoms.  Denies nausea, vomiting, diarrhea.  Reports decreased appetite.  Past Medical History:  Diagnosis Date  . Carpal tunnel syndrome, right   . Migraines   . Pelvic fracture (HCC)     There are no problems to display for this patient.   History reviewed. No pertinent surgical history.     No family history on file.  Social History   Tobacco Use  . Smoking status: Former Games developer  . Smokeless tobacco: Never Used  Substance Use Topics  . Alcohol use: Yes    Comment: occ  . Drug use: No    Home Medications Prior to Admission medications   Medication Sig Start Date End Date Taking? Authorizing Provider  benzonatate (TESSALON) 100 MG capsule Take 1 capsule (100 mg total) by mouth 3 (three) times daily as needed for cough. 07/26/18   Linwood Dibbles, MD  guaiFENesin (MUCINEX) 600 MG 12 hr tablet Take 1 tablet (600 mg total) by mouth 2 (two) times daily. 07/26/18   Linwood Dibbles, MD  naproxen (NAPROSYN) 500 MG tablet Take 1 tablet (500 mg total) by mouth 2 (two) times daily with a meal. As needed for pain 07/26/18   Linwood Dibbles, MD  ondansetron (ZOFRAN ODT) 4 MG disintegrating tablet Take 1  tablet (4 mg total) by mouth every 8 (eight) hours as needed for nausea or vomiting. 07/08/18   Elson Areas, PA-C    Allergies    Patient has no known allergies.  Review of Systems   Review of Systems  Constitutional: Positive for chills and fatigue. Negative for fever.  Respiratory: Positive for cough and shortness of breath.   Cardiovascular: Positive for chest pain. Negative for leg swelling.  Gastrointestinal: Negative for abdominal pain, diarrhea, nausea and vomiting.  All other systems reviewed and are negative.   Physical Exam Updated Vital Signs BP 99/66 (BP Location: Left Arm)   Pulse 80   Temp 98.3 F (36.8 C) (Oral)   Resp 20   Ht 1.854 m (6\' 1" )   Wt 65.8 kg   SpO2 96%   BMI 19.13 kg/m   Physical Exam Vitals and nursing note reviewed.  Constitutional:      Appearance: He is well-developed. He is not ill-appearing.  HENT:     Head: Normocephalic and atraumatic.     Nose: Nose normal.     Mouth/Throat:     Mouth: Mucous membranes are moist.  Eyes:     Pupils: Pupils are equal, round, and reactive to light.  Cardiovascular:     Rate and Rhythm: Normal rate and regular rhythm.     Heart sounds: Normal  heart sounds. No murmur heard.   Pulmonary:     Effort: Pulmonary effort is normal. No respiratory distress.     Breath sounds: Normal breath sounds. No wheezing.  Abdominal:     General: Bowel sounds are normal.     Palpations: Abdomen is soft.     Tenderness: There is no abdominal tenderness. There is no rebound.  Musculoskeletal:     Cervical back: Neck supple.     Right lower leg: No edema.     Left lower leg: No edema.  Lymphadenopathy:     Cervical: No cervical adenopathy.  Skin:    General: Skin is warm and dry.  Neurological:     Mental Status: He is alert and oriented to person, place, and time.  Psychiatric:        Mood and Affect: Mood normal.     ED Results / Procedures / Treatments   Labs (all labs ordered are listed, but only  abnormal results are displayed) Labs Reviewed  CBC WITH DIFFERENTIAL/PLATELET - Abnormal; Notable for the following components:      Result Value   Hemoglobin 12.5 (*)    HCT 35.9 (*)    All other components within normal limits  BASIC METABOLIC PANEL - Abnormal; Notable for the following components:   Glucose, Bld 126 (*)    Calcium 8.1 (*)    All other components within normal limits  D-DIMER, QUANTITATIVE (NOT AT [facility])  TROPONIN I (HIGH SENSITIVITY)    EKG None  Radiology DG Chest 2 View  Result Date: 03/29/2020 CLINICAL DATA:  39 year old male with cough.  Positive COVID-19. EXAM: CHEST - 2 VIEW COMPARISON:  Chest radiograph dated 07/26/2018. FINDINGS: The heart size and mediastinal contours are within normal limits. Both lungs are clear. The visualized skeletal structures are unremarkable. IMPRESSION: No active cardiopulmonary disease. Electronically Signed   By: Elgie Collard M.D.   On: 03/29/2020 21:52    Procedures Procedures (including critical care time)  Medications Ordered in ED Medications  sodium chloride 0.9 % bolus 500 mL (has no administration in time range)    ED Course  I have reviewed the triage vital signs and the nursing notes.  Pertinent labs & imaging results that were available during my care of the patient were reviewed by me and considered in my medical decision making (see chart for details).    MDM Rules/Calculators/A&P                           Patient presents with ongoing symptoms likely related to COVID-19.  He is overall nontoxic-appearing and vital signs are reassuring.  He satting on room air on my evaluation.  He describes ongoing chills, shortness of breath, some atypical chest pain.  EKG shows no evidence of acute ischemia.  Chest x-ray without pneumothorax or pneumonia.  Chest x-ray is clear without any other residual symptoms of COVID-19.  CBC and BMP are reassuring.  Troponin x1 is negative.  Doubt ACS.  Low suspicion for PE.   However, given recent infection, will obtain a D-dimer.  This was negative.  Doubt PE.  Patient may have some long-hauler symptoms of Covid.  Advised him of supportive measures.  After history, exam, and medical workup I feel the patient has been appropriately medically screened and is safe for discharge home. Pertinent diagnoses were discussed with the patient. Patient was given return precautions.   Final Clinical Impression(s) / ED Diagnoses Final diagnoses:  COVID-19  Atypical chest pain    Rx / DC Orders ED Discharge Orders    None       Meiko Stranahan, Mayer Masker, MD 03/30/20 [phone]

## 2020-03-30 NOTE — Discharge Instructions (Addendum)
You were seen today for ongoing symptoms likely related to COVID-19.  Make sure to stay hydrated.  Take Tylenol or Motrin for body aches or pain.

## 2020-04-08 ENCOUNTER — Emergency Department (HOSPITAL_COMMUNITY)
Admission: EM | Admit: 2020-04-08 | Discharge: 2020-04-08 | Disposition: A | Discharge status: Home / Self Care | Payer: 59 | Attending: Emergency Medicine | Admitting: Emergency Medicine

## 2020-04-08 ENCOUNTER — Encounter (HOSPITAL_COMMUNITY): Payer: Self-pay | Admitting: Emergency Medicine

## 2020-04-08 DIAGNOSIS — U071 COVID-19: Secondary | ICD-10-CM | POA: Insufficient documentation

## 2020-04-08 DIAGNOSIS — Z87891 Personal history of nicotine dependence: Secondary | ICD-10-CM | POA: Insufficient documentation

## 2020-04-08 DIAGNOSIS — R0602 Shortness of breath: Secondary | ICD-10-CM | POA: Diagnosis present

## 2020-04-08 MED ORDER — ONDANSETRON 4 MG PO TBDP: 4.0000 mg | ORAL_TABLET | Freq: Three times a day (TID) | ORAL | 0 refills | Status: DC | PRN

## 2020-04-08 NOTE — Discharge Instructions (Signed)
You are seen today for Covid symptoms, you can use the Zofran as needed for nausea, as directed.  Please go to the post Covid care center for follow-up.  Please come back to the emergency department for any new or worsening concerning symptoms.

## 2020-04-08 NOTE — ED Notes (Signed)
At rest pt o2 sats 98% on room air. Pt ambulatory; no s/s of acute distress noted with ambulation. o2 sats 98-99% on room air with ambulation.

## 2020-04-08 NOTE — ED Provider Notes (Signed)
[facility] EMERGENCY DEPARTMENT Provider Note   CSN: [id number] Arrival date & time: 04/08/20  5909     History Chief Complaint  Patient presents with  . continues to have COVID symptoms    Matthew Lester is a 39 y.o. male with no previous medical history that presents the emergency department today for continuous COVID-19 symptoms.  Patient states that he was diagnosed with Covid 9/26, did not receive monoclonal antibody, was not vaccinated.  Patient states that he has been having ongoing symptoms mainly headache, nausea and weakness for the last month.Also has some occasional mild shortness of breath. Per chart review patient has been seen here 10/11 for the same, at that time got CBC, BMP, D-dimer and troponin which were all unremarkable.  Patient states that main symptom right now is nausea, also states that he needs a work note until Friday.  Has not seen PCP for this.  Denies any numbness, tingling, neck pain, back pain, vision changes, fevers, chills.  Denies any tobacco use.  Denies any cough or chest pain.  States that he feels weak.  Has not been taking anything for this.Has been urinating normally, has been able to keep water and food down.  Does not feel nauseous currently.  HPI     Past Medical History:  Diagnosis Date  . Carpal tunnel syndrome, right   . Migraines   . Pelvic fracture (HCC)     There are no problems to display for this patient.   History reviewed. No pertinent surgical history.     No family history on file.  Social History   Tobacco Use  . Smoking status: Former Games developer  . Smokeless tobacco: Never Used  Substance Use Topics  . Alcohol use: Yes    Comment: occ  . Drug use: No    Home Medications Prior to Admission medications   Medication Sig Start Date End Date Taking? Authorizing Provider  benzonatate (TESSALON) 100 MG capsule Take 1 capsule (100 mg total) by mouth 3 (three) times daily as needed for cough. Patient  not taking: Reported on 04/08/2020 07/26/18   Linwood Dibbles, MD  guaiFENesin (MUCINEX) 600 MG 12 hr tablet Take 1 tablet (600 mg total) by mouth 2 (two) times daily. Patient not taking: Reported on 04/08/2020 07/26/18   Linwood Dibbles, MD  naproxen (NAPROSYN) 500 MG tablet Take 1 tablet (500 mg total) by mouth 2 (two) times daily with a meal. As needed for pain Patient not taking: Reported on 04/08/2020 07/26/18   Linwood Dibbles, MD  ondansetron (ZOFRAN ODT) 4 MG disintegrating tablet Take 1 tablet (4 mg total) by mouth every 8 (eight) hours as needed for nausea or vomiting. 04/08/20   Farrel Gordon, PA-C    Allergies    Patient has no known allergies.  Review of Systems   Review of Systems  Constitutional: Negative for diaphoresis, fatigue and fever.  HENT: Negative for ear discharge, sinus pressure, sore throat and trouble swallowing.   Eyes: Negative for visual disturbance.  Respiratory: Positive for shortness of breath. Negative for cough and chest tightness.   Cardiovascular: Negative for chest pain.  Gastrointestinal: Positive for diarrhea and nausea. Negative for abdominal pain and vomiting.  Genitourinary: Negative for flank pain.  Musculoskeletal: Positive for myalgias. Negative for back pain and joint swelling.  Skin: Negative for color change, pallor, rash and wound.  Neurological: Positive for weakness and headaches. Negative for dizziness, syncope, facial asymmetry, light-headedness and numbness.  Psychiatric/Behavioral: Negative for behavioral problems  and confusion.    Physical Exam Updated Vital Signs BP 116/82   Pulse 78   Temp 97.6 F (36.4 C) (Oral)   Resp 16   SpO2 96%   Physical Exam Constitutional:      General: He is not in acute distress.    Appearance: Normal appearance. He is not ill-appearing, toxic-appearing or diaphoretic.     Comments: Pt is sitting comfortably in bed.  HENT:     Head: Normocephalic and atraumatic.     Jaw: There is normal jaw occlusion. No  trismus, swelling or malocclusion.     Nose: No congestion or rhinorrhea.     Right Sinus: No maxillary sinus tenderness or frontal sinus tenderness.     Left Sinus: No maxillary sinus tenderness or frontal sinus tenderness.     Mouth/Throat:     Mouth: Mucous membranes are moist. No oral lesions.     Dentition: Normal dentition.     Tongue: No lesions.     Palate: No mass and lesions.     Pharynx: Oropharynx is clear. Uvula midline. No pharyngeal swelling, oropharyngeal exudate, posterior oropharyngeal erythema or uvula swelling.     Tonsils: No tonsillar exudate or tonsillar abscesses. 1+ on the right. 1+ on the left.  Eyes:     General: No visual field deficit.       Right eye: No discharge.        Left eye: No discharge.     Extraocular Movements: Extraocular movements intact.     Conjunctiva/sclera: Conjunctivae normal.     Pupils: Pupils are equal, round, and reactive to light.  Cardiovascular:     Rate and Rhythm: Normal rate and regular rhythm.     Pulses: Normal pulses.     Heart sounds: Normal heart sounds. No murmur heard.  No friction rub. No gallop.   Pulmonary:     Effort: Pulmonary effort is normal. No respiratory distress.     Breath sounds: Normal breath sounds. No stridor. No wheezing, rhonchi or rales.  Chest:     Chest wall: No tenderness.  Abdominal:     General: Abdomen is flat. Bowel sounds are normal. There is no distension.     Palpations: Abdomen is soft.     Tenderness: There is no abdominal tenderness. There is no right CVA tenderness or left CVA tenderness.  Musculoskeletal:        General: No swelling or tenderness. Normal range of motion.     Cervical back: Normal range of motion. No rigidity or tenderness.     Right lower leg: No edema.     Left lower leg: No edema.  Lymphadenopathy:     Cervical: No cervical adenopathy.  Skin:    General: Skin is warm and dry.     Capillary Refill: Capillary refill takes less than 2 seconds.     Findings: No  erythema or rash.  Neurological:     General: No focal deficit present.     Mental Status: He is alert and oriented to person, place, and time.     Cranial Nerves: Cranial nerves are intact. No cranial nerve deficit or facial asymmetry.     Sensory: No sensory deficit.     Motor: Motor function is intact. No weakness.     Coordination: Coordination is intact.     Gait: Gait is intact. Gait normal.  Psychiatric:        Mood and Affect: Mood normal.     ED Results / Procedures /  Treatments   Labs (all labs ordered are listed, but only abnormal results are displayed) Labs Reviewed - No data to display  EKG None  Radiology No results found.  Procedures Procedures (including critical care time)  Medications Ordered in ED Medications - No data to display  ED Course  I have reviewed the triage vital signs and the nursing notes.  Pertinent labs & imaging results that were available during my care of the patient were reviewed by me and considered in my medical decision making (see chart for details).    MDM Rules/Calculators/A&P                         Matthew Lester is a 39 y.o. male with no pertinent medical history that presents the emergency department today for continuous COVID-19 symptoms.  Patient appears well, no acute distress.  Patient is satting at 98% on room air satting at 99% upon ambulation. Ppatient's lungs clear.  Do not think we need work-up at this time, patient was here a week ago and got labs.  Will refer patient to post Covid care clinic for follow-up.  Patient states that he is here because he needs a work note until Friday.  Patient has a normal neuro exam with normal gait, patient to be discharged.  Will prescribe Zofran for patient's nausea.  Doubt need for further emergent work up at this time. I explained the diagnosis and have given explicit precautions to return to the ER including for any other new or worsening symptoms. The patient understands and  accepts the medical plan as it's been dictated and I have answered their questions. Discharge instructions concerning home care and prescriptions have been given. The patient is STABLE and is discharged to home in good condition.   Final Clinical Impression(s) / ED Diagnoses Final diagnoses:  COVID-19    Rx / DC Orders ED Discharge Orders         Ordered    ondansetron (ZOFRAN ODT) 4 MG disintegrating tablet  Every 8 hours PRN        04/08/20 0759           Farrel Gordon, PA-C 04/08/20 0810    Virgina Norfolk, DO 04/08/20 [phone]

## 2020-04-08 NOTE — ED Triage Notes (Signed)
Pt reports he continues to have COVID symptoms including headaches, SOB, no appetite, generalized weakness and just feeling poorly.  Was diagnosed on 9/24.

## 2020-04-08 NOTE — ED Notes (Signed)
Pt d/c home per MD order. Discharge summary reviewed with pt, pt verbalizes understanding. No s/s of acute distress noted. Ambulatory off unit.  °

## 2020-04-16 ENCOUNTER — Telehealth: Payer: Self-pay | Admitting: General Practice

## 2020-04-16 NOTE — Telephone Encounter (Signed)
Pt was called per referral from [facility] to make appt w/ [facility]. No answer

## 2020-04-25 ENCOUNTER — Encounter (HOSPITAL_COMMUNITY): Payer: Self-pay | Admitting: Emergency Medicine

## 2020-04-25 ENCOUNTER — Other Ambulatory Visit: Payer: Self-pay

## 2020-04-25 ENCOUNTER — Emergency Department (HOSPITAL_COMMUNITY)
Admission: EM | Admit: 2020-04-25 | Discharge: 2020-04-26 | Disposition: A | Discharge status: Home / Self Care | Payer: 59 | Attending: Emergency Medicine | Admitting: Emergency Medicine

## 2020-04-25 DIAGNOSIS — K529 Noninfective gastroenteritis and colitis, unspecified: Secondary | ICD-10-CM | POA: Diagnosis not present

## 2020-04-25 DIAGNOSIS — R109 Unspecified abdominal pain: Secondary | ICD-10-CM | POA: Diagnosis present

## 2020-04-25 DIAGNOSIS — Z87891 Personal history of nicotine dependence: Secondary | ICD-10-CM | POA: Insufficient documentation

## 2020-04-25 LAB — COMPREHENSIVE METABOLIC PANEL
ALT: 41 U/L (ref 0–44)
AST: 80 U/L — ABNORMAL HIGH (ref 15–41)
Albumin: 3.9 g/dL (ref 3.5–5.0)
Alkaline Phosphatase: 43 U/L (ref 38–126)
Anion gap: 9 (ref 5–15)
BUN: 14 mg/dL (ref 6–20)
CO2: 26 mmol/L (ref 22–32)
Calcium: 9 mg/dL (ref 8.9–10.3)
Chloride: 102 mmol/L (ref 98–111)
Creatinine, Ser: 1.02 mg/dL (ref 0.61–1.24)
GFR, Estimated: >60 mL/min (ref >60)
Glucose, Bld: 85 mg/dL (ref 70–99)
Potassium: 4.1 mmol/L (ref 3.5–5.1)
Sodium: 137 mmol/L (ref 135–145)
Total Bilirubin: 0.5 mg/dL (ref 0.3–1.2)
Total Protein: 6.8 g/dL (ref 6.5–8.1)

## 2020-04-25 LAB — CBC
HCT: 33.8 % — ABNORMAL LOW (ref 39.0–52.0)
Hemoglobin: 11.4 g/dL — ABNORMAL LOW (ref 13.0–17.0)
MCH: 29.4 pg (ref 26.0–34.0)
MCHC: 33.7 g/dL (ref 30.0–36.0)
MCV: 87.1 fL (ref 80.0–100.0)
Platelets: 289 10*3/uL (ref 150–400)
RBC: 3.88 MIL/uL — ABNORMAL LOW (ref 4.22–5.81)
RDW: 13.7 % (ref 11.5–15.5)
WBC: 9 10*3/uL (ref 4.0–10.5)
nRBC: 0 % (ref 0.0–0.2)

## 2020-04-25 LAB — TYPE AND SCREEN
ABO/RH(D): A POS
Antibody Screen: NEGATIVE

## 2020-04-25 LAB — LIPASE, BLOOD: Lipase: 49 U/L (ref 11–51)

## 2020-04-25 NOTE — ED Triage Notes (Signed)
Pt reports generalized abd pain, "black" diarrhea, headache, sinus pressure, and generalized weakness since last Sunday.  States he feels like he has COVID again.  States he tested + for COVID 10/23.  Per chart pt COVID + 9/26.

## 2020-04-25 NOTE — ED Notes (Signed)
Patient is back

## 2020-04-25 NOTE — ED Notes (Signed)
Patient stepped outside to his car.

## 2020-04-26 ENCOUNTER — Emergency Department (HOSPITAL_COMMUNITY): Payer: 59

## 2020-04-26 LAB — URINALYSIS, ROUTINE W REFLEX MICROSCOPIC
Bilirubin Urine: NEGATIVE
Glucose, UA: NEGATIVE mg/dL
Hgb urine dipstick: NEGATIVE
Ketones, ur: NEGATIVE mg/dL
Leukocytes,Ua: NEGATIVE
Nitrite: NEGATIVE
Protein, ur: NEGATIVE mg/dL
Specific Gravity, Urine: >1.046 — ABNORMAL HIGH (ref 1.005–1.030)
pH: 7 (ref 5.0–8.0)

## 2020-04-26 LAB — POC OCCULT BLOOD, ED: Fecal Occult Bld: POSITIVE — AB

## 2020-04-26 MED ORDER — HYDROMORPHONE HCL 1 MG/ML IJ SOLN
0.5000 mg | Freq: Once | INTRAMUSCULAR | Status: AC
  Administered 2020-04-26: 0.5 mg via INTRAVENOUS
  Filled 2020-04-26: qty 1

## 2020-04-26 MED ORDER — IOHEXOL 300 MG/ML  SOLN
100.0000 mL | Freq: Once | INTRAMUSCULAR | Status: AC | PRN
  Administered 2020-04-26: 100 mL via INTRAVENOUS

## 2020-04-26 MED ORDER — CIPROFLOXACIN HCL 500 MG PO TABS: 500.0000 mg | ORAL_TABLET | Freq: Two times a day (BID) | ORAL | 0 refills | Status: AC

## 2020-04-26 MED ORDER — DIPHENHYDRAMINE HCL 25 MG PO CAPS
25.0000 mg | ORAL_CAPSULE | Freq: Once | ORAL | Status: AC
  Administered 2020-04-26: 25 mg via ORAL
  Filled 2020-04-26: qty 1

## 2020-04-26 MED ORDER — SODIUM CHLORIDE 0.9 % IV BOLUS
500.0000 mL | Freq: Once | INTRAVENOUS | Status: AC
  Administered 2020-04-26: 500 mL via INTRAVENOUS

## 2020-04-26 MED ORDER — FENTANYL CITRATE (PF) 100 MCG/2ML IJ SOLN
50.0000 ug | Freq: Once | INTRAMUSCULAR | Status: AC
  Administered 2020-04-26: 50 ug via INTRAVENOUS
  Filled 2020-04-26: qty 2

## 2020-04-26 MED ORDER — ONDANSETRON HCL 4 MG/2ML IJ SOLN
4.0000 mg | Freq: Once | INTRAMUSCULAR | Status: AC
  Administered 2020-04-26: 4 mg via INTRAVENOUS
  Filled 2020-04-26: qty 2

## 2020-04-26 NOTE — ED Notes (Addendum)
C/o "whole body itching" from that medicine. No rash noted, no resp distress.  Requesting a work note from last Sunday to cover Sunday- today for missing work--

## 2020-04-26 NOTE — Discharge Instructions (Signed)
Please follow-up both with a primary care doctor as well as gastroenterology.  If you have worsening blood in stools, abdominal pain, fever, return to ER for reassessment.  Take antibiotic as prescribed.

## 2020-04-26 NOTE — ED Provider Notes (Signed)
[facility] EMERGENCY DEPARTMENT Provider Note   CSN: [id number] Arrival date & time: 04/25/20  1656     History Chief Complaint  Patient presents with  . Abdominal Pain    Matthew Lester is a 39 y.o. male.  Presents to ER with concern for abdominal pain.  Patient reports that for the past week he has been having intermittent abdominal pain, diarrhea, generalized weakness and fatigue.  States that his stools have also been dark, black at times.  No bright red blood.  Denies prior history of GI bleed.  Abdomen is generally painful, no particular area.  No fevers.  Had Covid recently.  Some nausea but no vomiting.  HPI     Past Medical History:  Diagnosis Date  . Carpal tunnel syndrome, right   . Migraines   . Pelvic fracture (HCC)     There are no problems to display for this patient.   History reviewed. No pertinent surgical history.     No family history on file.  Social History   Tobacco Use  . Smoking status: Former Games developer  . Smokeless tobacco: Never Used  Substance Use Topics  . Alcohol use: Yes    Comment: occ  . Drug use: No    Home Medications Prior to Admission medications   Medication Sig Start Date End Date Taking? Authorizing Provider  ciprofloxacin (CIPRO) 500 MG tablet Take 1 tablet (500 mg total) by mouth 2 (two) times daily for 5 days. 04/26/20 05/01/20  Milagros Loll, MD    Allergies    Patient has no known allergies.  Review of Systems   Review of Systems  Constitutional: Positive for fatigue. Negative for chills and fever.  HENT: Negative for ear pain and sore throat.   Eyes: Negative for pain and visual disturbance.  Respiratory: Negative for cough and shortness of breath.   Cardiovascular: Negative for chest pain and palpitations.  Gastrointestinal: Positive for abdominal pain, blood in stool, diarrhea and nausea. Negative for vomiting.  Genitourinary: Negative for dysuria and hematuria.  Musculoskeletal:  Negative for arthralgias and back pain.  Skin: Negative for color change and rash.  Neurological: Negative for seizures and syncope.  All other systems reviewed and are negative.   Physical Exam Updated Vital Signs BP 132/89 (BP Location: Right Arm)   Pulse (!) 55   Temp 97.9 F (36.6 C) (Oral)   Resp 16   Ht 6\' 1"  (1.854 m)   Wt 74.8 kg   SpO2 100%   BMI 21.77 kg/m   Physical Exam Vitals and nursing note reviewed.  Constitutional:      Appearance: He is well-developed.  HENT:     Head: Normocephalic and atraumatic.  Eyes:     Conjunctiva/sclera: Conjunctivae normal.  Cardiovascular:     Rate and Rhythm: Normal rate and regular rhythm.     Heart sounds: No murmur heard.   Pulmonary:     Effort: Pulmonary effort is normal. No respiratory distress.     Breath sounds: Normal breath sounds.  Abdominal:     Palpations: Abdomen is soft.     Tenderness: There is abdominal tenderness.     Comments: Generalized tenderness, no rebound or guarding  Musculoskeletal:     Cervical back: Neck supple.  Skin:    General: Skin is warm and dry.  Neurological:     Mental Status: He is alert.     ED Results / Procedures / Treatments   Labs (all labs ordered are  listed, but only abnormal results are displayed) Labs Reviewed  COMPREHENSIVE METABOLIC PANEL - Abnormal; Notable for the following components:      Result Value   AST 80 (*)    All other components within normal limits  CBC - Abnormal; Notable for the following components:   RBC 3.88 (*)    Hemoglobin 11.4 (*)    HCT 33.8 (*)    All other components within normal limits  URINALYSIS, ROUTINE W REFLEX MICROSCOPIC - Abnormal; Notable for the following components:   Color, Urine STRAW (*)    Specific Gravity, Urine >1.046 (*)    All other components within normal limits  POC OCCULT BLOOD, ED - Abnormal; Notable for the following components:   Fecal Occult Bld POSITIVE (*)    All other components within normal limits    LIPASE, BLOOD  TYPE AND SCREEN  ABO/RH    EKG None  Radiology CT ABDOMEN PELVIS W CONTRAST  Result Date: 04/26/2020 CLINICAL DATA:  Generalized weakness, nausea and vomiting for 6 days, COVID-19 positive, diagnosed 6 weeks prior EXAM: CT ABDOMEN AND PELVIS WITH CONTRAST TECHNIQUE: Multidetector CT imaging of the abdomen and pelvis was performed using the standard protocol following bolus administration of intravenous contrast. CONTRAST:  OMNIPAQUE IOHEXOL 300 MG/ML  SOLN COMPARISON:  None. FINDINGS: Lower chest: Lung bases are clear. Normal heart size. No pericardial effusion. Hepatobiliary: No worrisome focal liver lesions. Smooth liver surface contour. Normal hepatic attenuation. Normal gallbladder and biliary tree. Pancreas: No pancreatic ductal dilatation or surrounding inflammatory changes. Spleen: Normal in size. No concerning splenic lesions. Adrenals/Urinary Tract: Normal adrenal glands. Kidneys are normally located with symmetric enhancement and excretion. No suspicious renal lesion, urolithiasis or hydronephrosis. Mild bladder wall thickening and some questionable bladder debris dependently. Stomach/Bowel: Distal esophagus, stomach and duodenal sweep are unremarkable. No small bowel thickening or dilatation. Diffusely fluid-filled appearance of the small bowel is nonspecific. A normal appendix is visualized. No colonic dilatation or wall thickening. No evidence of obstruction. Vascular/Lymphatic: Atherosclerotic calcifications within the abdominal aorta and branch vessels. No aneurysm or ectasia. No enlarged abdominopelvic lymph nodes. Reproductive: Borderline prostatomegaly with some mild indentation of the bladder base. Prostate seminal vesicles are otherwise unremarkable. Other: No abdominopelvic free fluid or free gas. No bowel containing hernias. Musculoskeletal: No acute osseous abnormality or suspicious osseous lesion. Minimal degenerative changes in the lower lumbar spine and  hips. Likely degenerative left os acetabuli. Likely remote posttraumatic deformity of the right iliac wing. IMPRESSION: 1. Mild bladder wall thickening and some questionable bladder debris dependently. Possibly related to outlet obstruction with a borderline prostate indenting the bladder base. Should correlate with urinalysis to exclude cystitis. 2. Diffusely fluid-filled appearance of the small bowel is nonspecific, but can be seen with enteritis. 3. Aortic Atherosclerosis (ICD10-I70.0). Electronically Signed   By: Kreg Shropshire M.D.   On: 04/26/2020 03:35    Procedures Procedures (including critical care time)  Medications Ordered in ED Medications  ondansetron (ZOFRAN) injection 4 mg (4 mg Intravenous Given 04/26/20 0239)  fentaNYL (SUBLIMAZE) injection 50 mcg (50 mcg Intravenous Given 04/26/20 0239)  sodium chloride 0.9 % bolus 500 mL (0 mLs Intravenous Stopped 04/26/20 0513)  iohexol (OMNIPAQUE) 300 MG/ML solution 100 mL (100 mLs Intravenous Contrast Given 04/26/20 0313)  HYDROmorphone (DILAUDID) injection 0.5 mg (0.5 mg Intravenous Given 04/26/20 0511)  diphenhydrAMINE (BENADRYL) capsule 25 mg (25 mg Oral Given 04/26/20 0739)    ED Course  I have reviewed the triage vital signs and the nursing notes.  Pertinent labs & imaging results that were available during my care of the patient were reviewed by me and considered in my medical decision making (see chart for details).    MDM Rules/Calculators/A&P                         39 year old male presents to ER with concern for abdominal pain, dark stools.  On exam patient noted to be remarkably well-appearing in no distress, did note some tenderness across his abdomen.  His stool was Reser on rectal exam, heme positive.  Hemoglobin stable.  CT abdomen pelvis consistent with enteritis, questionable bladder wall thickening.  Urinalysis negative for infection.  Suspect symptoms most likely related to enteritis.  Will cover for possible bacterial  causes with ciprofloxacin.  Given the dark stools, will recommend follow-up with gastroenterology.  Patient remains well-appearing on reassessment with stable vital signs.  Believe he is appropriate for discharge and outpatient management at this time.   After the discussed management above, the patient was determined to be safe for discharge.  The patient was in agreement with this plan and all questions regarding their care were answered.  ED return precautions were discussed and the patient will return to the ED with any significant worsening of condition.  Final Clinical Impression(s) / ED Diagnoses Final diagnoses:  Enteritis    Rx / DC Orders ED Discharge Orders         Ordered    Ambulatory referral to Gastroenterology       Comments: Blood in stool   04/26/20 0644    ciprofloxacin (CIPRO) 500 MG tablet  2 times daily        04/26/20 0646           Milagros Loll, MD 04/26/20 [phone]

## 2021-01-25 IMAGING — DX DG CHEST 2V
2 series · 2 of 2 positions shown · non-contrast
Comparison: Chest radiograph dated 07/26/2018.

CLINICAL DATA: 39-year-old male with cough.  Positive [id number].

EXAM:
CHEST - 2 VIEW

[chest pa]
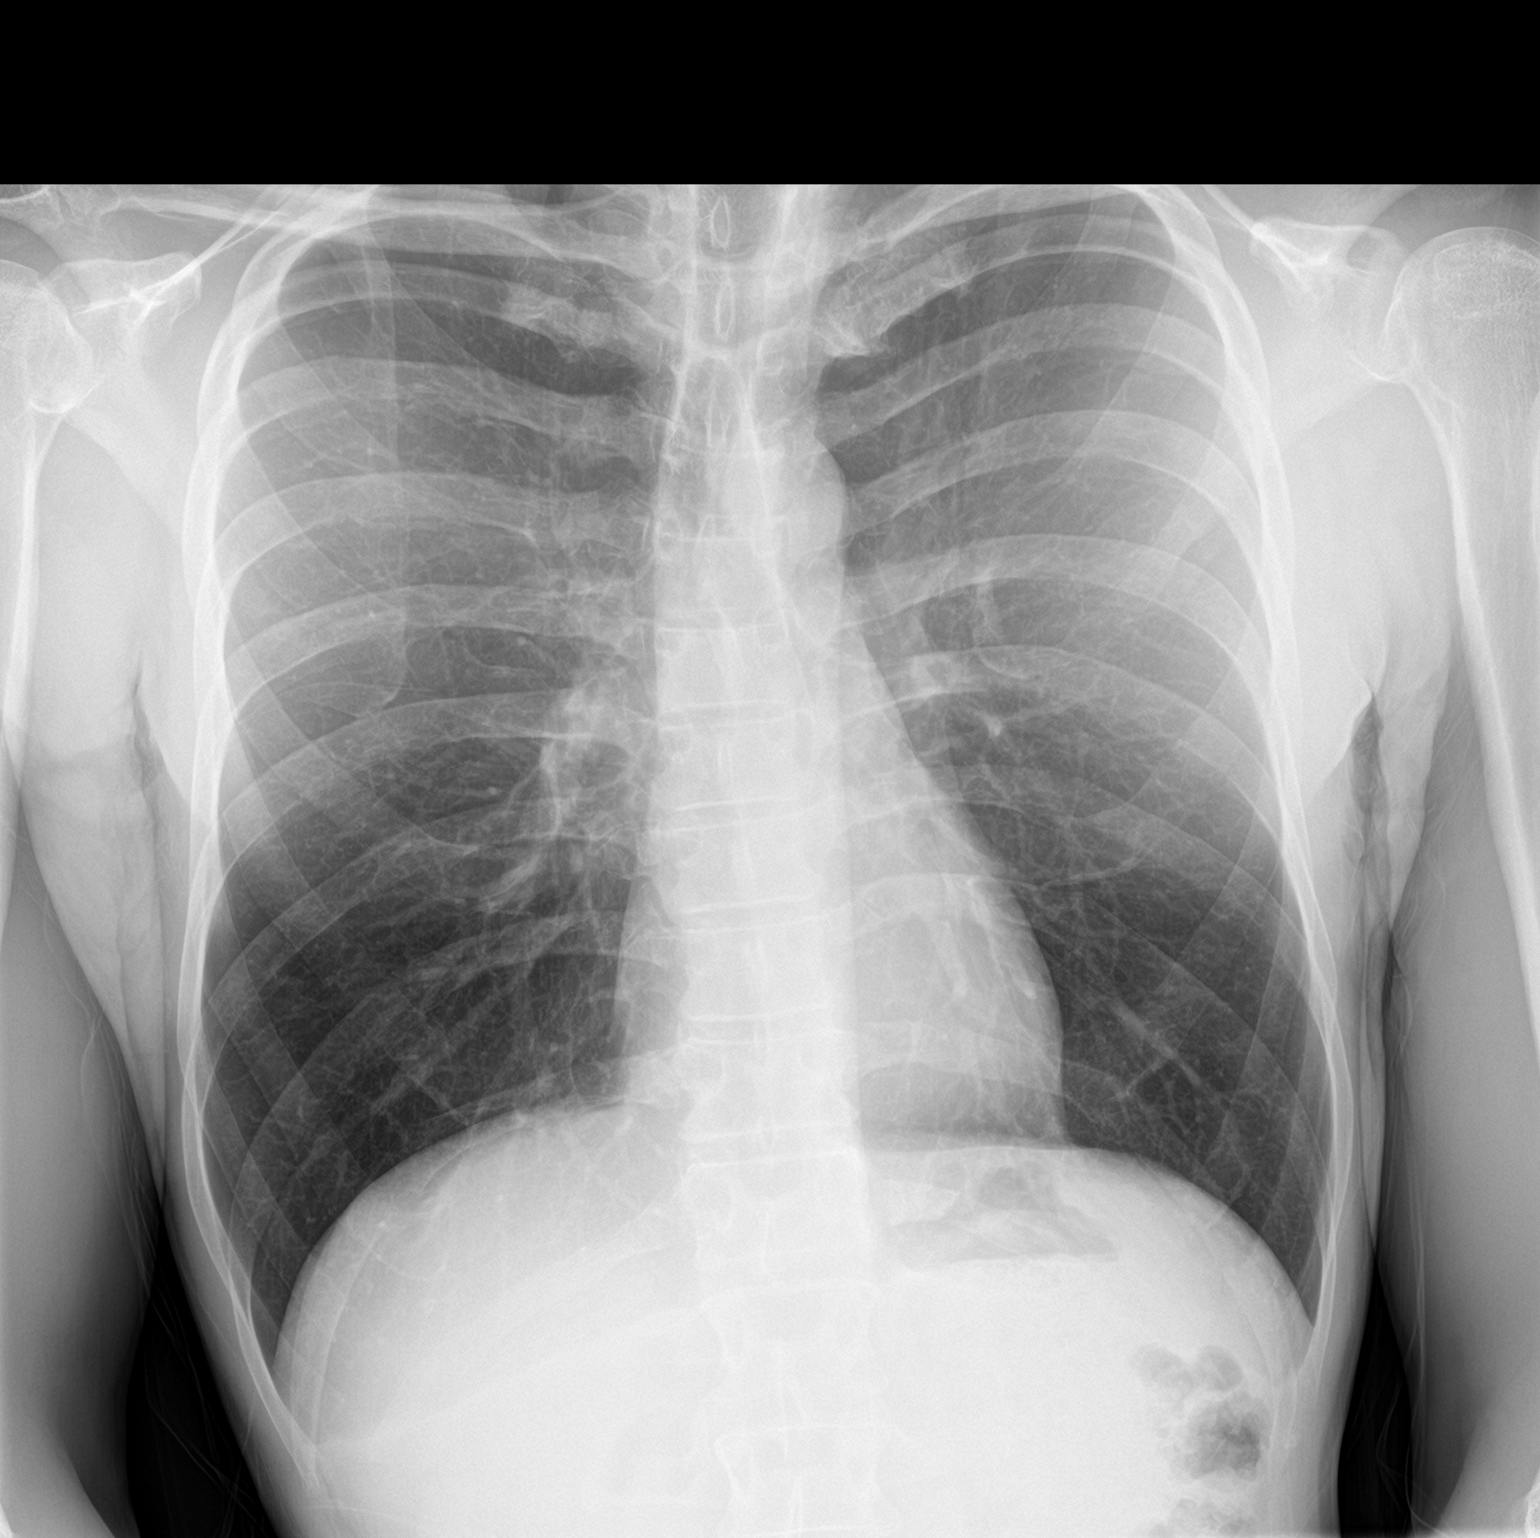

[chest lat]
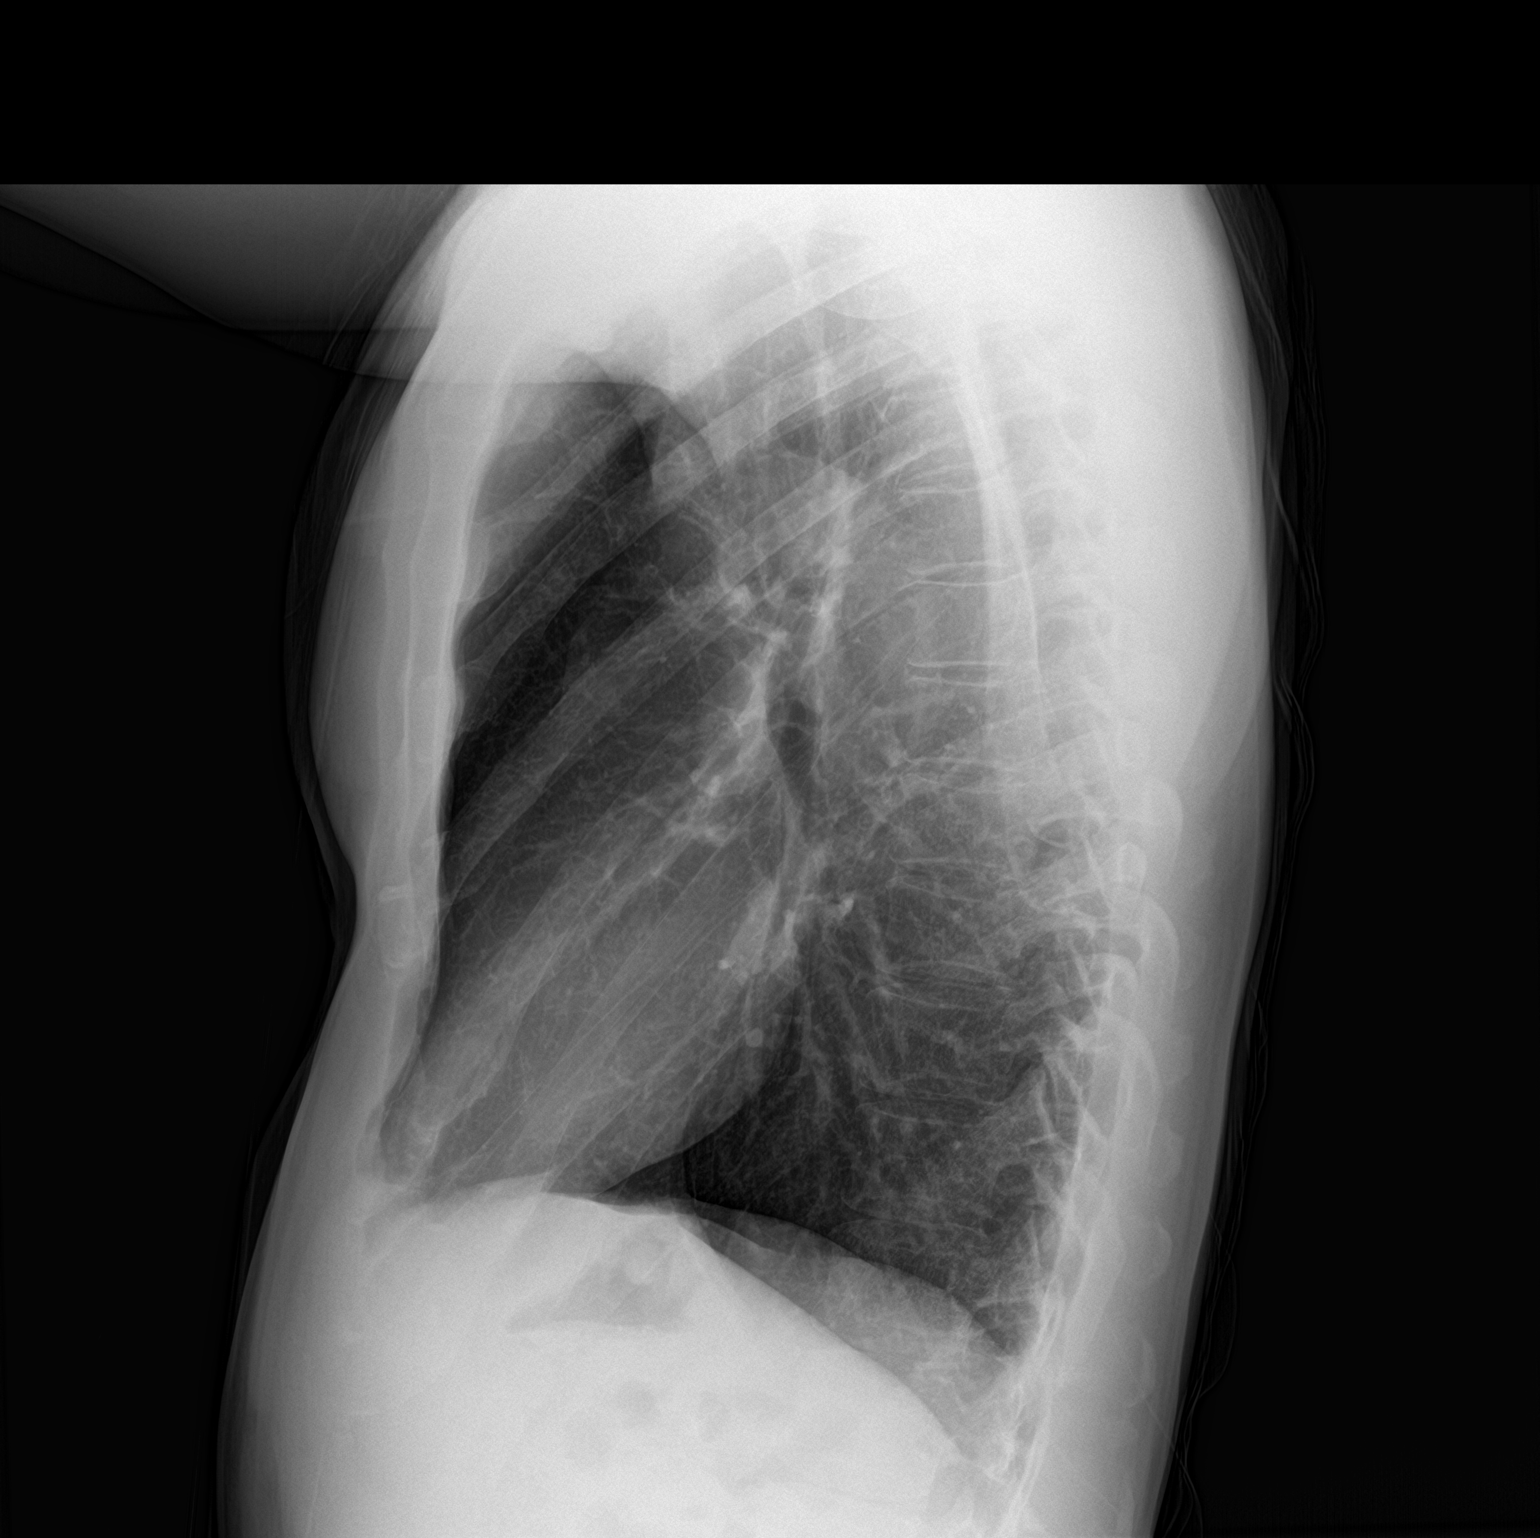

[2 of 2 positions shown; findings below may reference images not displayed]

FINDINGS: The heart size and mediastinal contours are within normal limits.
Both lungs are clear. The visualized skeletal structures are
unremarkable.
IMPRESSION: No active cardiopulmonary disease.

## 2023-05-17 ENCOUNTER — Other Ambulatory Visit: Payer: Self-pay

## 2023-05-17 ENCOUNTER — Encounter (HOSPITAL_COMMUNITY): Payer: Self-pay | Admitting: Emergency Medicine

## 2023-05-17 ENCOUNTER — Emergency Department (HOSPITAL_COMMUNITY)
Admission: EM | Admit: 2023-05-17 | Discharge: 2023-05-17 | Disposition: A | Discharge status: Home / Self Care | Payer: 59 | Attending: Emergency Medicine | Admitting: Emergency Medicine

## 2023-05-17 DIAGNOSIS — R5383 Other fatigue: Secondary | ICD-10-CM | POA: Insufficient documentation

## 2023-05-17 DIAGNOSIS — R519 Headache, unspecified: Secondary | ICD-10-CM | POA: Insufficient documentation

## 2023-05-17 DIAGNOSIS — M791 Myalgia, unspecified site: Secondary | ICD-10-CM | POA: Insufficient documentation

## 2023-05-17 DIAGNOSIS — R531 Weakness: Secondary | ICD-10-CM | POA: Insufficient documentation

## 2023-05-17 DIAGNOSIS — G4700 Insomnia, unspecified: Secondary | ICD-10-CM | POA: Insufficient documentation

## 2023-05-17 LAB — CBC
HCT: 34.5 % — ABNORMAL LOW (ref 39.0–52.0)
Hemoglobin: 12.7 g/dL — ABNORMAL LOW (ref 13.0–17.0)
MCH: 29.6 pg (ref 26.0–34.0)
MCHC: 36.8 g/dL — ABNORMAL HIGH (ref 30.0–36.0)
MCV: 80.4 fL (ref 80.0–100.0)
Platelets: 115 10*3/uL — ABNORMAL LOW (ref 150–400)
RBC: 4.29 MIL/uL (ref 4.22–5.81)
RDW: 11.9 % (ref 11.5–15.5)
WBC: 4.7 10*3/uL (ref 4.0–10.5)
nRBC: 0 % (ref 0.0–0.2)

## 2023-05-17 LAB — BASIC METABOLIC PANEL
Anion gap: 15 (ref 5–15)
BUN: 13 mg/dL (ref 6–20)
CO2: 19 mmol/L — ABNORMAL LOW (ref 22–32)
Calcium: 8.6 mg/dL — ABNORMAL LOW (ref 8.9–10.3)
Chloride: 99 mmol/L (ref 98–111)
Creatinine, Ser: 1.19 mg/dL (ref 0.61–1.24)
GFR, Estimated: >60 mL/min (ref >60)
Glucose, Bld: 78 mg/dL (ref 70–99)
Potassium: 3.8 mmol/L (ref 3.5–5.1)
Sodium: 133 mmol/L — ABNORMAL LOW (ref 135–145)

## 2023-05-17 LAB — MAGNESIUM: Magnesium: 1.9 mg/dL (ref 1.7–2.4)

## 2023-05-17 LAB — T4, FREE: Free T4: 1.02 ng/dL (ref 0.61–1.12)

## 2023-05-17 LAB — TSH: TSH: 2.788 u[IU]/mL (ref 0.350–4.500)

## 2023-05-17 MED ORDER — PREDNISONE 20 MG PO TABS
60.0000 mg | ORAL_TABLET | Freq: Once | ORAL | Status: AC
  Administered 2023-05-17: 60 mg via ORAL
  Filled 2023-05-17: qty 3

## 2023-05-17 MED ORDER — PREDNISONE 10 MG PO TABS: 40.0000 mg | ORAL_TABLET | Freq: Every day | ORAL | 0 refills | Status: AC

## 2023-05-17 NOTE — ED Triage Notes (Signed)
Pt reports generalized body aches, pain, loss of appetite, and weight loss x 1 month.

## 2023-05-17 NOTE — Discharge Instructions (Addendum)
Follow-up with your primary care doctor.  Your blood work and vital signs are reassuring in the ER did not show any medical emergencies.  It is not clear what the cause of your symptoms are, but you may need further workup as an outpatient.  We did discuss trying a short course of steroids.  Sometimes steroids can help with certain autoimmune diseases, weakness, fatigue.  These can also help stimulate the appetite.  If you do begin to experience unpleasant side effects of these medicines, including difficulty with sleeping, hyperactivity, nausea, or other symptoms, please stop taking the prednisone steroid.

## 2023-05-17 NOTE — ED Provider Notes (Signed)
[place] [facility] AT [facility] Provider Note   CSN: [id number] Arrival date & time: 05/17/23  0753     History  Chief Complaint  Patient presents with   Generalized Body Aches    Matthew Lester is a 42 y.o. male presented to ED with chronic aches and pains.  The patient reports has been a chronic ongoing issue for several months or years.  He has been seen in several ED's over the past 3 years with generalized weakness, muscle pains, headaches, insomnia.  He has also seen his PCP and reports he has had "a whole bunch of blood work" done as an outpatient.  He says no one has been able to find a solution for symptoms.  He has been tried on gabapentin does not feel that works, and also prescribed Ambien which she says "does help you sleep at night".  He presents today because he feels "particularly weak".  He describes a loss of appetite and weight loss over the past month.  HPI     Home Medications Prior to Admission medications   Medication Sig Start Date End Date Taking? Authorizing Provider  predniSONE (DELTASONE) 10 MG tablet Take 4 tablets (40 mg total) by mouth daily with breakfast for 5 days. 05/18/23 05/23/23 Yes Cloma Rahrig, Kermit Balo, MD  zolpidem (AMBIEN) 10 MG tablet Take 10 mg by mouth at bedtime as needed for sleep. 03/07/23  Yes [provider]  gabapentin (NEURONTIN) 100 MG capsule Take 100-200 mg by mouth 3 (three) times daily as needed (pain). Patient not taking: Reported on 05/17/2023 03/07/23   [provider]      Allergies    Patient has no known allergies.    Review of Systems   Review of Systems  Physical Exam Updated Vital Signs BP (!) 143/90 (BP Location: Right Arm)   Pulse 65   Temp 98.1 F (36.7 C) (Oral)   Resp 18   SpO2 100%  Physical Exam Constitutional:      General: He is not in acute distress. HENT:     Head: Normocephalic and atraumatic.  Eyes:     Conjunctiva/sclera: Conjunctivae normal.      Pupils: Pupils are equal, round, and reactive to light.  Cardiovascular:     Rate and Rhythm: Normal rate and regular rhythm.  Pulmonary:     Effort: Pulmonary effort is normal. No respiratory distress.  Abdominal:     General: There is no distension.     Tenderness: There is no abdominal tenderness.  Skin:    General: Skin is warm and dry.  Neurological:     General: No focal deficit present.     Mental Status: He is alert. Mental status is at baseline.  Psychiatric:        Mood and Affect: Mood normal.        Behavior: Behavior normal.     ED Results / Procedures / Treatments   Labs (all labs ordered are listed, but only abnormal results are displayed) Labs Reviewed  BASIC METABOLIC PANEL - Abnormal; Notable for the following components:      Result Value   Sodium 133 (*)    CO2 19 (*)    Calcium 8.6 (*)    All other components within normal limits  CBC - Abnormal; Notable for the following components:   Hemoglobin 12.7 (*)    HCT 34.5 (*)    MCHC 36.8 (*)    Platelets 115 (*)    All  other components within normal limits  MAGNESIUM  TSH  T4, FREE    EKG None  Radiology No results found.  Procedures Procedures    Medications Ordered in ED Medications  predniSONE (DELTASONE) tablet 60 mg (60 mg Oral Given 05/17/23 6644)    ED Course/ Medical Decision Making/ A&P                                 Medical Decision Making Amount and/or Complexity of Data Reviewed Labs: ordered.  Risk Prescription drug management.   This patient presents to the ED with concern for fatigue, low energy, muscle aches. This involves an extensive number of treatment options, and is a complaint that carries with it a high risk of complications and morbidity.  The differential diagnosis includes derangement versus anemia versus thyroid dysfunction versus metabolic derangement versus autoimmune disorder versus other   I ordered and personally interpreted labs.  The pertinent  results include: No emergent findings.  Very mild hyponatremia likely related to diminished appetite  I ordered medication including prednisone  I have reviewed the patients home medicines and have made adjustments as needed  Test Considered: Low suspicion for sepsis, acute PE, infection, meningitis, intracranial tumor injury.  No indication for x-ray imaging, low suspicion for pneumonia.   Dispostion:  After consideration of the diagnostic results and the patients response to treatment, I feel that the patent would benefit from outpatient follow-up.  I encouraged the patient to follow-up with his PCP for this chronic condition.  We did discuss a trial of prednisone at home which may help with stimulating his appetite and providing some sense of energy, but may also be be therapeutic if this is potentially an autoimmune condition yet undiagnosed.  If he does benefit from the prednisone he symptomatically is feeling improved, this is something he could discuss with his PCP, and may need further workup for autoimmune disease.  At this time there is no clinical indication for hospitalization.         Final Clinical Impression(s) / ED Diagnoses Final diagnoses:  Other fatigue  Myalgia    Rx / DC Orders ED Discharge Orders          Ordered    predniSONE (DELTASONE) 10 MG tablet  Daily with breakfast        05/17/23 1133              Terald Sleeper, MD 05/17/23 1134

## 2023-12-11 ENCOUNTER — Emergency Department (HOSPITAL_BASED_OUTPATIENT_CLINIC_OR_DEPARTMENT_OTHER): Payer: Self-pay

## 2023-12-11 ENCOUNTER — Encounter (HOSPITAL_BASED_OUTPATIENT_CLINIC_OR_DEPARTMENT_OTHER): Payer: Self-pay | Admitting: Emergency Medicine

## 2023-12-11 ENCOUNTER — Emergency Department (HOSPITAL_BASED_OUTPATIENT_CLINIC_OR_DEPARTMENT_OTHER): Payer: Self-pay | Admitting: Radiology

## 2023-12-11 ENCOUNTER — Other Ambulatory Visit: Payer: Self-pay

## 2023-12-11 ENCOUNTER — Emergency Department (HOSPITAL_BASED_OUTPATIENT_CLINIC_OR_DEPARTMENT_OTHER)
Admission: EM | Admit: 2023-12-11 | Discharge: 2023-12-11 | Disposition: A | Discharge status: Home / Self Care | Payer: Self-pay | Attending: Emergency Medicine | Admitting: Emergency Medicine

## 2023-12-11 DIAGNOSIS — S52571A Other intraarticular fracture of lower end of right radius, initial encounter for closed fracture: Secondary | ICD-10-CM | POA: Insufficient documentation

## 2023-12-11 DIAGNOSIS — W11XXXA Fall on and from ladder, initial encounter: Secondary | ICD-10-CM | POA: Insufficient documentation

## 2023-12-11 DIAGNOSIS — Y9389 Activity, other specified: Secondary | ICD-10-CM | POA: Insufficient documentation

## 2023-12-11 DIAGNOSIS — S52501A Unspecified fracture of the lower end of right radius, initial encounter for closed fracture: Secondary | ICD-10-CM

## 2023-12-11 MED ORDER — OXYCODONE HCL 5 MG PO TABS: 5.0000 mg | ORAL_TABLET | Freq: Four times a day (QID) | ORAL | 0 refills | Status: AC | PRN

## 2023-12-11 MED ORDER — OXYCODONE-ACETAMINOPHEN 5-325 MG PO TABS
1.0000 | ORAL_TABLET | Freq: Once | ORAL | Status: AC
  Administered 2023-12-11: 1 via ORAL
  Filled 2023-12-11: qty 1

## 2023-12-11 MED ORDER — IBUPROFEN 400 MG PO TABS
600.0000 mg | ORAL_TABLET | Freq: Once | ORAL | Status: AC
  Administered 2023-12-11: 600 mg via ORAL
  Filled 2023-12-11: qty 1

## 2023-12-11 NOTE — ED Triage Notes (Addendum)
 Fall from ladder while cutting trees. Happened a few days ago. Right arm pain, in shoulder , swelling in forearm dn wrist Some back pain , right rib area Provider to exam in triage

## 2023-12-11 NOTE — ED Notes (Signed)
 Fall risk bundle is currently in place.

## 2023-12-11 NOTE — Discharge Instructions (Addendum)
 Your x-rays today shows that you have a fracture of your radius which is your arm bone. It is broken at the very end by your wrist.    The x-ray of your right elbow and right shoulder did not show any signs of a fracture or dislocation.  The CT of your head did not show any skull fracture or brain bleed.  You have been placed into a splint to hold the fracture in place.  Please keep this splint clean and dry.  Wrap the splint with a garbage bag or Saran wrap to keep it dry while showering or bathing.  Do not take off the splint.   Do not lift objects heavier than 5lbs with the right hand.   You may take up to 1000mg  of tylenol  every 6 hours as needed for pain. Do not take more then 4g per day.   You may use up to 600mg  ibuprofen  every 6 hours as needed for pain.  Do not exceed 2.4g of ibuprofen  per day.  You may alternate these medications for complete coverage. See instructions below: Take 600mg  ibuprofen , then 3 hours later take 1000mg  tylenol , then 3 hours later 600mg  ibuprofen , then 3 hours later 1000mg  tylenol , so on and so forth for pain control.    You have been prescribed Oxycodone -this is a narcotic/controlled substance medication that has potential addicting qualities.  You may take 1 tablet every 6 hours as needed for severe pain not controlled with tylenol  and ibuprofen .  Do not drive or operate heavy machinery when taking this medicine as it can be sedating. Do not drink alcohol or take other sedating medications when taking this medicine for safety reasons.  Keep this out of reach of small children.     Call the orthopedics office listed below (Dr. Alyse) as soon as possible to schedule a follow-up appointment within the next week  Return to the ER for any uncontrolled pain, numbness or tingling, discoloration, any other new or concerning symptoms.

## 2023-12-11 NOTE — ED Provider Notes (Signed)
 [place] EMERGENC[facility] DEPARTMENT AT [facility] Provider Note   CSN: [id number] Arrival date & time: 12/11/23  1317     Patient presents with: Matthew Lester is a 43 y.o. right hand dominant male with right-sided carpal tunnel syndrome, presents with concern for right wrist pain and right shoulder pain after falling from a 12 foot ladder 3 days ago.  States he landed on grass.  Had immediate pain and swelling in the right wrist, but did not seek care until today.  Denies any numbness or tingling in his right upper extremity.  He did hit his head, but denies any loss of consciousness.    Fall       Prior to Admission medications   Medication Sig Start Date End Date Taking? Authorizing Provider  oxyCODONE  (ROXICODONE ) 5 MG immediate release tablet Take 1 tablet (5 mg total) by mouth every 6 (six) hours as needed for up to 5 days for severe pain (pain score 7-10) or breakthrough pain (Pain not controlled with Tylenol  ibuprofen ). 12/11/23 12/16/23 Yes Veta Palma, PA-C  gabapentin (NEURONTIN) 100 MG capsule Take 100-200 mg by mouth 3 (three) times daily as needed (pain). Patient not taking: Reported on 05/17/2023 03/07/23   [provider]  zolpidem (AMBIEN) 10 MG tablet Take 10 mg by mouth at bedtime as needed for sleep. 03/07/23   [provider]    Allergies: Patient has no known allergies.    Review of Systems  Musculoskeletal:        Right wrist pain    Updated Vital Signs BP (!) 166/100 (BP Location: Right Arm)   Pulse 84   Temp 98.2 F (36.8 C) (Oral)   Resp 18   SpO2 99%   Physical Exam Vitals and nursing note reviewed.  Constitutional:      Appearance: Normal appearance.  HENT:     Head: Normocephalic and atraumatic.   Eyes:     Extraocular Movements: Extraocular movements intact.     Pupils: Pupils are equal, round, and reactive to light.    Cardiovascular:     Rate and Rhythm: Normal rate and regular rhythm.      Comments: 2+ radial pulse bilaterally Pulmonary:     Effort: Pulmonary effort is normal.     Breath sounds: Normal breath sounds.  Abdominal:     General: There is no distension.     Palpations: Abdomen is soft.     Tenderness: There is no abdominal tenderness.   Musculoskeletal:     Comments: General No obvious deformity but diffuse edema over the right wrist. No erythema, edema, contusions, open wounds   Palpation No tenderness of the right clavicle.  Mild tenderness to the right shoulder and right elbow diffusely, no point tenderness to palpation.  No tenderness palpation of the proximal radius or ulna.  Tender to palpation diffusely over the right wrist.  Nontender to palpation of the right 1st through 5th metacarpals and phalanges.  Non-tender to palpation of the left clavicle, humerus, radius and ulna, carpal bones, 1st-5th metacarpals and phalanges  Non tender over the femur, patella, tibia or fibula bilaterally  Non-tender over the cervical, thoracic, or lumbar spinous processes. Non-tender to palpation of the paraspinal region of the back or chest wall diffusely  No tenderness of the pelvis diffusely  ROM Unable to perform range of motion of the right wrist due to pain and swelling.  Intact left wrist flexion extension Full ROM of shoulders bilaterally Full elbow and  knee flexion and extension bilaterally Intact plantarflexion and dorsiflexion, hip flexion bilaterally  Sensation: Sensation intact throughout the bilateral upper and lower extremity  Strength: 5/5 strength with resisted hand squeeze bilaterally     Neurological:     General: No focal deficit present.     Mental Status: He is alert.   Psychiatric:        Mood and Affect: Mood normal.        Behavior: Behavior normal.     (all labs ordered are listed, but only abnormal results are displayed) Labs Reviewed - No data to display  EKG: None  Radiology: DG Wrist Complete Right Result Date:  12/11/2023 CLINICAL DATA:  Fall from ladder.  Swelling in forearm. EXAM: RIGHT WRIST - COMPLETE 3+ VIEW COMPARISON:  None Available. FINDINGS: There is comminuted and mildly displaced fracture of the distal right radial metaphysis with intra-articular extension. No other acute fracture or dislocation. No aggressive osseous lesion. No significant arthritis of the wrist joint. No radiopaque foreign bodies. Soft tissues are within normal limits. IMPRESSION: *Comminuted and mildly displaced fracture of the distal right radial metaphysis with intra-articular extension. Electronically Signed   By: Ree Molt M.D.   On: 12/11/2023 15:14   DG Elbow Complete Right Result Date: 12/11/2023 CLINICAL DATA:  injury. EXAM: RIGHT ELBOW - COMPLETE 3+ VIEW COMPARISON:  None Available. FINDINGS: No acute fracture or dislocation. No aggressive osseous lesion. No significant elbow joint arthritis. No radiopaque foreign bodies. Soft tissues are within normal limits. IMPRESSION: *No acute osseous abnormality of the right elbow. Electronically Signed   By: Ree Molt M.D.   On: 12/11/2023 15:12   CT Head Wo Contrast Result Date: 12/11/2023 CLINICAL DATA:  Fall from ladder. EXAM: CT HEAD WITHOUT CONTRAST TECHNIQUE: Contiguous axial images were obtained from the base of the skull through the vertex without intravenous contrast. RADIATION DOSE REDUCTION: This exam was performed according to the departmental dose-optimization program which includes automated exposure control, adjustment of the mA and/or kV according to patient size and/or use of iterative reconstruction technique. COMPARISON:  None Available. FINDINGS: Brain: No evidence of acute infarction, hemorrhage, hydrocephalus, extra-axial collection or mass lesion/mass effect. Ventricles are normal. Cerebral volume is age appropriate. Vascular: No hyperdense vessel or unexpected calcification. Skull: Normal. Negative for fracture or focal lesion. Sinuses/Orbits: No acute  finding. Mucoperiosteal thickening noted in the left ethmoidal air cells. Other: Visualized mastoid air cells are unremarkable. No mastoid effusion. IMPRESSION: *No acute intracranial abnormality. Electronically Signed   By: Ree Molt M.D.   On: 12/11/2023 15:10   DG Shoulder Right Result Date: 12/11/2023 CLINICAL DATA:  injury.  Fall from ladder. EXAM: RIGHT SHOULDER - 2+ VIEW COMPARISON:  None Available. FINDINGS: No acute fracture or dislocation. No aggressive osseous lesion. Glenohumeral and acromioclavicular joints are normal in alignment and exhibit no significant degenerative changes. No soft tissue swelling. No radiopaque foreign bodies. IMPRESSION: No acute osseous abnormality of the right shoulder. Electronically Signed   By: Ree Molt M.D.   On: 12/11/2023 15:07     Procedures   Medications Ordered in the ED  oxyCODONE -acetaminophen  (PERCOCET/ROXICET) 5-325 MG per tablet 1 tablet (1 tablet Oral Given 12/11/23 1339)  ibuprofen  (ADVIL ) tablet 600 mg (600 mg Oral Given 12/11/23 1615)    Clinical Course as of 12/11/23 1758  Mon Dec 11, 2023  1745 Pt requested shoulder sling which was provided [AF]    Clinical Course User Index [AF] Veta Palma, PA-C  Medical Decision Making Amount and/or Complexity of Data Reviewed Radiology: ordered.  Risk Prescription drug management.     Differential diagnosis includes but is not limited to fracture, dislocation, intracranial hemorrhage, skull fracture, pneumothorax  ED Course:  Upon initial evaluation, patient is well-appearing, no acute distress.  Stable vitals aside from his elevated blood pressure 166/100.  Significant edema diffusely of the right wrist, reporting significant pain in this area and unable to perform right wrist range of motion due to pain.  Diffusely tender over the right shoulder and right elbow as well, but no point tenderness to palpation.  He has full range of motion  of the right shoulder and right elbow.  No tenderness palpation of the 1st through 5th metatarsals or phalanges of the right upper extremity.  No tenderness to palpation of the left upper extremity or bilateral lower extremities.  He has been able to ambulate without difficulty since the accident. He is neurovascularly intact in the bilateral upper extremities.  No cervical, thoracic, or lumbar spinal tenderness palpation.  He did hit his head, but denies loss of consciousness. No signs of head trauma such as ecchymoses, hematomas, or abrasions/lacerations. No focal neurodeficits.  No headache, vision changes, nausea or vomiting.   Imaging Studies ordered: I ordered imaging studies including x-ray right wrist, x-ray right elbow, x-ray right shoulder, CT head I independently visualized the imaging with scope of interpretation limited to determining acute life threatening conditions related to emergency care.  X-ray of the right wrist with comminuted and mildly displaced fracture of the distal right radial metaphysis with intra-articular extension X-ray of the right elbow and right shoulder negative for acute fracture or dislocation CT head without any evidence of skull fracture or intracranial hemorrhage I agree with the radiologist interpretation   Consultations Obtained: I requested consultation with the orthopedic Dr.Spears,  and discussed lab and imaging findings as well as pertinent plan - they recommend: splint and follow up in their [facility] office.  He does not feel any CT imaging of the right wrist is needed at this time.  Medications Given: Percocet and ibuprofen   Patient placed into short arm splint and upon re-evaluation, patient remains neurovascularly intact after splint placement.  He requested a shoulder sling which was provided for him.  Stable and appropriate for discharge home  Impression: Right distal radius fracture  Disposition:  The patient was discharged home with  instructions to call orthopedic Dr. Alyse office as soon as possible to set up an appointment within the next week.  Keep splint on and dry.  Take Tylenol  and ibuprofen  as needed for pain.  May take oxycodone  as needed for breakthrough pain not controlled with Tylenol  and ibuprofen .  He understands he may not drink alcohol, drive, or operate heavy machinery after taking this medication. Return precautions given.   This chart was dictated using voice recognition software, Dragon. Despite the best efforts of this provider to proofread and correct errors, errors may still occur which can change documentation meaning.       Final diagnoses:  Closed fracture of distal end of right radius, unspecified fracture morphology, initial encounter    ED Discharge Orders          Ordered    oxyCODONE  (ROXICODONE ) 5 MG immediate release tablet  Every 6 hours PRN        12/11/23 1756               Veta Palma, PA-C 12/11/23 1758    [place],  Matthew CROME, MD 12/12/23 1344

## 2023-12-11 NOTE — ED Notes (Signed)
 Pt ask for something to drink, advised currently NPO.
